# Patient Record
Sex: Female | Born: 1965
Health system: Southern US, Community
[De-identification: ages and names within clinical notes are randomized; demographics above are authoritative.]

## PROBLEM LIST (undated history)

## (undated) DIAGNOSIS — G43909 Migraine, unspecified, not intractable, without status migrainosus: Secondary | ICD-10-CM

## (undated) DIAGNOSIS — Z87891 Personal history of nicotine dependence: Secondary | ICD-10-CM

## (undated) DIAGNOSIS — M542 Cervicalgia: Secondary | ICD-10-CM

## (undated) DIAGNOSIS — M62838 Other muscle spasm: Secondary | ICD-10-CM

## (undated) DIAGNOSIS — F3281 Premenstrual dysphoric disorder: Secondary | ICD-10-CM

## (undated) DIAGNOSIS — J069 Acute upper respiratory infection, unspecified: Secondary | ICD-10-CM

## (undated) HISTORY — DX: Migraine, unspecified, not intractable, without status migrainosus: G43.909

## (undated) HISTORY — PX: BREAST ENHANCEMENT SURGERY: SHX7

## (undated) HISTORY — DX: Cervicalgia: M54.2

## (undated) HISTORY — DX: Acute upper respiratory infection, unspecified: J06.9

## (undated) HISTORY — DX: Other muscle spasm: M62.838

## (undated) HISTORY — DX: Personal history of nicotine dependence: Z87.891

## (undated) HISTORY — DX: Premenstrual dysphoric disorder: F32.81

---

## 2000-09-09 ENCOUNTER — Encounter: Payer: Self-pay | Admitting: Obstetrics & Gynecology

## 2000-09-09 ENCOUNTER — Inpatient Hospital Stay (HOSPITAL_COMMUNITY): Admission: AD | Admit: 2000-09-09 | Discharge: 2000-09-09 | Payer: Self-pay | Admitting: Obstetrics & Gynecology

## 2001-06-29 ENCOUNTER — Inpatient Hospital Stay (HOSPITAL_COMMUNITY): Admission: AD | Admit: 2001-06-29 | Discharge: 2001-06-29 | Payer: Self-pay | Admitting: Obstetrics & Gynecology

## 2002-03-17 ENCOUNTER — Other Ambulatory Visit: Admission: RE | Admit: 2002-03-17 | Discharge: 2002-03-17 | Payer: Self-pay | Admitting: Obstetrics and Gynecology

## 2002-04-21 ENCOUNTER — Ambulatory Visit (HOSPITAL_COMMUNITY): Admission: RE | Admit: 2002-04-21 | Discharge: 2002-04-21 | Payer: Self-pay | Admitting: Obstetrics and Gynecology

## 2002-10-06 ENCOUNTER — Inpatient Hospital Stay (HOSPITAL_COMMUNITY): Admission: AD | Admit: 2002-10-06 | Discharge: 2002-10-09 | Payer: Self-pay | Admitting: Obstetrics and Gynecology

## 2002-10-06 ENCOUNTER — Encounter (INDEPENDENT_AMBULATORY_CARE_PROVIDER_SITE_OTHER): Payer: Self-pay | Admitting: Specialist

## 2003-11-03 ENCOUNTER — Emergency Department (HOSPITAL_COMMUNITY): Admission: EM | Admit: 2003-11-03 | Discharge: 2003-11-03 | Payer: Self-pay | Admitting: Family Medicine

## 2004-08-22 ENCOUNTER — Other Ambulatory Visit: Admission: RE | Admit: 2004-08-22 | Discharge: 2004-08-22 | Payer: Self-pay | Admitting: Obstetrics and Gynecology

## 2005-08-23 ENCOUNTER — Other Ambulatory Visit: Admission: RE | Admit: 2005-08-23 | Discharge: 2005-08-23 | Payer: Self-pay | Admitting: Obstetrics and Gynecology

## 2005-10-23 ENCOUNTER — Ambulatory Visit: Payer: Self-pay | Admitting: Family Medicine

## 2007-04-18 ENCOUNTER — Emergency Department (HOSPITAL_COMMUNITY): Admission: EM | Admit: 2007-04-18 | Discharge: 2007-04-18 | Payer: Self-pay | Admitting: Emergency Medicine

## 2008-12-02 ENCOUNTER — Ambulatory Visit: Payer: Self-pay | Admitting: Family Medicine

## 2008-12-02 DIAGNOSIS — M62838 Other muscle spasm: Secondary | ICD-10-CM | POA: Insufficient documentation

## 2008-12-02 DIAGNOSIS — M542 Cervicalgia: Secondary | ICD-10-CM | POA: Insufficient documentation

## 2009-08-04 ENCOUNTER — Ambulatory Visit: Payer: Self-pay | Admitting: Family Medicine

## 2009-12-02 ENCOUNTER — Ambulatory Visit: Payer: Self-pay | Admitting: Family Medicine

## 2009-12-02 DIAGNOSIS — J069 Acute upper respiratory infection, unspecified: Secondary | ICD-10-CM | POA: Insufficient documentation

## 2009-12-13 ENCOUNTER — Telehealth: Payer: Self-pay | Admitting: Family Medicine

## 2010-06-28 ENCOUNTER — Ambulatory Visit: Payer: Self-pay | Admitting: Family Medicine

## 2010-10-17 NOTE — Progress Notes (Signed)
Summary: pt is not any better  Phone Note Call from Patient Call back at Home Phone [phone]   Caller: Patient Call For: Judith Part MD Summary of Call: Pt was seen on 3/18 and she says she is not any better.  She thinks she has a sinus infection and is requesting an antibiotic.  She said you told her you would send one in for her if she wasnt better.  [facility]. Initial call taken by: Lowella Petties CMA,  December 13, 2009 8:24 AM    New/Updated Medications: AUGMENTIN 875-125 MG TABS (AMOXICILLIN-POT CLAVULANATE) 1 by mouth 2 times daily x 10 days Prescriptions: AUGMENTIN 875-125 MG TABS (AMOXICILLIN-POT CLAVULANATE) 1 by mouth 2 times daily x 10 days  #20 x 0   Entered and Authorized by:   Ruthe Mannan MD   Signed by:   Ruthe Mannan MD on 12/13/2009   Method used:   [facility]        [facility] (retail[facility]       [address]       [place], [place]  [address]       Ph: [phone] or [phone]       Fax: [phone]   RxID:   [id number]   Appended Document: pt is not any better Patient Advised.

## 2010-10-17 NOTE — Assessment & Plan Note (Signed)
Summary: CONGESTION,FATIGUE/CLE   Vital Signs:  Patient profile:   45 year old female Height:      68.5 inches Weight:      160.25 pounds BMI:     24.10 Temp:     98.2 degrees F oral Pulse rate:   80 / minute Pulse rhythm:   regular BP sitting:   100 / 70  (left arm) Cuff size:   regular  Vitals Entered By: Delilah Shan CMA Duncan Dull) (December 02, 2009 9:43 AM) CC: Congestion, fatigue   History of Present Illness: 45 yo with 6 days of URI symptoms.  Started with runny nose, dry cough.  Those symptoms resolved. Ears still popping and just feels "more run down." No SOB, CP, LE edema.   No n/v/d. No rashes.  Current Medications (verified): 1)  None  Allergies: 1)  ! Vicodin (Hydrocodone-Acetaminophen)  Review of Systems      See HPI General:  Denies chills and fever. ENT:  Complains of earache and postnasal drainage; denies difficulty swallowing, ear discharge, sinus pressure, and sore throat. CV:  Denies chest pain or discomfort. Resp:  Complains of cough; denies shortness of breath, sputum productive, and wheezing.  Physical Exam  General:  Well-developed,well-nourished,in no acute distress; alert,appropriate and cooperative throughout examination Ears:  small clear fluid TMs bilaterally Nose:  mucosal erythema.   Mouth:  mild pharyngeal erythema.   Lungs:  Normal respiratory effort, chest expands symmetrically. Lungs are clear to auscultation, no crackles or wheezes. Heart:  Normal rate and regular rhythm. S1 and S2 normal without gallop, murmur, click, rub or other extra sounds. Extremities:  no edema Psych:  Cognition and judgment appear intact. Alert and cooperative with normal attention span and concentration. No apparent delusions, illusions, hallucinations   Impression & Recommendations:  Problem # 1:  URI (ICD-465.9) Assessment New LIkely viral.  Conitnue supportive care with Ibuprofen, sudafed. RTC if no improvement in 5-7 days.  Prior Medications  (reviewed today): None Current Allergies (reviewed today): ! VICODIN (HYDROCODONE-ACETAMINOPHEN)

## 2010-10-17 NOTE — Assessment & Plan Note (Signed)
Summary: flu shot/tower/alc  Nurse Visit   Allergies: 1)  ! Vicodin (Hydrocodone-Acetaminophen)  Orders Added: 1)  Admin 1st Vaccine [90471] 2)  Flu Vaccine 23yrs + [16109]  Flu Vaccine Consent Questions     Do you have a history of severe allergic reactions to this vaccine? no    Any prior history of allergic reactions to egg and/or gelatin? no    Do you have a sensitivity to the preservative Thimersol? no    Do you have a past history of Guillan-Barre Syndrome? no    Do you currently have an acute febrile illness? no    Have you ever had a severe reaction to latex? no    Vaccine information given and explained to patient? yes    Are you currently pregnant? no    Lot Number:[id number]   Exp Date:03/17/2011   Site Given  Left Deltoid IM  Appended Document: flu shot/tower/alc Flu vaccine given in patients right deltoid.

## 2011-02-02 NOTE — Discharge Summary (Signed)
   NAME:  Kathleen Rowe, Kathleen Rowe                           ACCOUNT NO.:  [id number]   MEDICAL RECORD NO.:  [id number]                   PATIENT TYPE:  INP   LOCATION:  9138                                 FACILITY:  [facility]   PHYSICIAN:  Miguel Aschoff, M.D.                    DATE OF BIRTH:  04/09/1966   DATE OF ADMISSION:  10/06/2002  DATE OF DISCHARGE:  10/09/2002                                 DISCHARGE SUMMARY   FINAL DIAGNOSES:  1. Intrauterine pregnancy at term.  2. History of prior low transverse cesarean section.  The patient declines     attempt at vaginal birth after cesarean.  3. The patient desires permanent sterilization.   PROCEDURE:  1. Repeat low transverse cesarean section.  2. Bilateral tubal ligation using the Pomeroy procedure.   SURGEON:  Malva Limes, M.D.   ASSISTANT:  Luvenia Redden, M.D.   COMPLICATIONS:  None.   HISTORY OF PRESENT ILLNESS:  This 45 year old G6, P1-0-4-1 presents at 39+  weeks gestation for repeat cesarean section and tubal ligation.  The patient  was offered a VBAC and declined.  The patient's antepartum course had been  complicated by advanced maternal age.  The patient did receive an  amniocentesis with a 46XX genotype.  Otherwise, patient's antepartum course  had been uncomplicated.  She did have a negative group B Strep culture  performed in the office.  She was admitted at this time and taken to the  operating room on October 06, 2002 where a repeat low transverse cesarean  section was performed with the delivery of an 8 pound 15 ounce female infant  with Apgars of 9 and 10.  The delivery went without complications and at  this point patient still expressed her desires for permanent sterilization  and a bilateral tubal ligation using the Pomeroy procedure was performed.  The procedure went without complications.  The patient's postoperative  course was benign without significant fevers.  She was felt ready for  discharge on postoperative day  number three.  She was sent home on a regular  diet, told to decrease activities, told to continue her prenatal vitamins.  Was told to take Chromagen iron one daily.  Given a prescription for Tylox  one to two q.4h. as needed for pain.  Was to follow up in the office in four  weeks.  Even though patient was Rh negative she did not receive RhoGAM  because her husband is Rh negative as well.   LABORATORIES:  On discharge patient had a hemoglobin of 9.2 and a white  blood cell count of 12.0.     Leilani Able, P.A.-C.                Miguel Aschoff, M.D.    MB/MEDQ  D:  11/12/2002  T:  11/12/2002  Job:  [id number]

## 2011-02-02 NOTE — Op Note (Signed)
NAME:  Kathleen Rowe, Kathleen Rowe                           ACCOUNT NO.:  [id number]   MEDICAL RECORD NO.:  [id number]                   PATIENT TYPE:  INP   LOCATION:  9138                                 [facility]ACILITY:  [facility]   PHYSICIAN:  Malva Limes, M.D.                 DATE OF BIRTH:  1965-11-07   DATE OF PROCEDURE:  10/06/2002  DATE OF DISCHARGE:                                 OPERATIVE REPORT   PREOPERATIVE DIAGNOSES:  1. Intrauterine pregnancy at term.  2. History of prior low transverse cesarean section.  3. Patient declines attempt at vaginal birth after cesarean section.  4. Patient desires permanent sterilization.  5. Advanced maternal age.   POSTOPERATIVE DIAGNOSES:  1. Intrauterine pregnancy at term.  2. History of prior low transverse cesarean section.  3. Patient declines attempt at vaginal birth after cesarean section.  4. Patient desires permanent sterilization.  5. Advanced maternal age.   PROCEDURES:  1. Repeat low transverse cesarean section.  2. Bilateral tubal ligation, Pomeroy procedure.   SURGEON:  Mark E. Dareen Piano, M.D.   ASSISTANT:  Luvenia Redden, M.D.   ESTIMATED BLOOD LOSS:  900 cc.   COMPLICATIONS:  None.   ANTIBIOTICS:  Ancef 1 g.   DRAINS:  Foley to bedside drainage.   SPECIMENS:  Portions of right and left fallopian tubes sent to pathology.   FINDINGS:  The patient had normal-appearing fallopian tubes and ovaries,  bilaterally.  The uterus appeared to be normal.  The placenta appeared to be  normal. The patient delivered one live viable white female infant weighing 8  pounds 15 ounces.   DESCRIPTION OF PROCEDURE:  The patient was taken to the operating room where  a regional anesthetic was administered without complications.  She was then  placed in the dorsal supine position with a left lateral tilt.  The patient  was prepped with Hibiclens and a Foley catheter was placed.  She was draped  in the usual fashion for this procedure.  A  Pfannenstiel incision was made  through the previous scar.  This was carried down to fascia.  The fascia was  entered in the midline and extended laterally with the Mayo scissors.  The  rectus muscles were then taken from the fascia with the Bovie.  The rectus  muscles were divided in the midline and taken superiorly and inferiorly.  The parietal peritoneum was entered sharply.  The bladder flap was taken  down sharply.  A low transverse uterine incision was made in the midline and  extended laterally with blunt dissection.  Amniotic sac was entered sharply  and fluid was noted to be clear.  The infant was delivered with the  assistance of a vacuum extractor.  On delivery of the head, the oropharynx  and nostrils were bulb suctioned.  The remaining infant was then delivered.  The cord was doubly clamped and cut, and the  infant handed to the awaiting  NICU team.  The placenta was then manually removed.  The uterus was then  exteriorized.  The uterine cavity was wiped with a wet lap.  The uterine  incision was closed in a single layer of 0 chromic in a running locking  fashion.  The bladder flap was closed using 3-0 chromic in a running  fashion.  The right fallopian tube was then grasped with a Babcock in the  isthmic portion.  A 2-3 cm knuckle was then doubly ligated with 0 gut  suture.  The knuckle was excised.  Both ostia were visualized.  A similar  procedure was performed on the opposite side.  The posterior cul-de-sac was  then irrigated.  The uterus was placed back into the abdominal cavity.  Hemostasis was again checked and felt to be adequate.  The parietal  peritoneum and rectus muscles were reapproximated in the midline using 3-0  chromic in a running fashion.  The fascia was closed using 0 Monocryl suture  in a running fashion.  Subcuticular tissues were treated with a Bovie.  Pfannenstiel clips were used to close the skin.  The patient tolerated the  procedure well.  She was  taken to the recovery room in stable condition.  Instrument and lap counts were correct x2.                                               Malva Limes, M.D.    MA/MEDQ  D:  10/06/2002  T:  10/06/2002  Job:  [id number]

## 2011-07-25 ENCOUNTER — Ambulatory Visit (INDEPENDENT_AMBULATORY_CARE_PROVIDER_SITE_OTHER): Payer: BC Managed Care – PPO

## 2011-07-25 DIAGNOSIS — Z23 Encounter for immunization: Secondary | ICD-10-CM

## 2012-03-24 ENCOUNTER — Encounter: Payer: Self-pay | Admitting: Family Medicine

## 2012-03-24 ENCOUNTER — Ambulatory Visit (INDEPENDENT_AMBULATORY_CARE_PROVIDER_SITE_OTHER): Payer: BC Managed Care – PPO | Admitting: Family Medicine

## 2012-03-24 VITALS — BP 110/70 | HR 72 | Temp 99.4°F | Wt 172.0 lb

## 2012-03-24 DIAGNOSIS — N390 Urinary tract infection, site not specified: Secondary | ICD-10-CM

## 2012-03-24 LAB — POCT URINALYSIS DIPSTICK
Bilirubin, UA: NEGATIVE
Glucose, UA: NEGATIVE
Ketones, UA: NEGATIVE
Leukocytes, UA: NEGATIVE
Nitrite, UA: POSITIVE
Spec Grav, UA: <=1.005
Urobilinogen, UA: NEGATIVE
pH, UA: 7.5

## 2012-03-24 MED ORDER — CIPROFLOXACIN HCL 500 MG PO TABS: 500.0000 mg | ORAL_TABLET | Freq: Two times a day (BID) | ORAL | Status: AC

## 2012-03-24 MED ORDER — FLUCONAZOLE 150 MG PO TABS: 150.0000 mg | ORAL_TABLET | Freq: Once | ORAL | Status: AC

## 2012-03-24 NOTE — Addendum Note (Signed)
Addended by: Eliezer Bottom on: 03/24/2012 04:09 PM   Modules accepted: Orders

## 2012-03-24 NOTE — Progress Notes (Signed)
SUBJECTIVE: Kathleen Rowe is a 46 y.o. female who complains of urinary frequency, urgency and dysuria x 3 days, without flank pain, fever, chills, or abnormal vaginal discharge or bleeding.   Patient Active Problem List  Diagnosis  . URI  . NECK PAIN, ACUTE  . MUSCLE SPASM, TRAPEZIUS   Past Medical History  Diagnosis Date  . Migraine   . History of tobacco abuse   . PMDD (premenstrual dysphoric disorder)   . Spasm of muscle   . Cervicalgia   . Acute upper respiratory infections of unspecified site    Past Surgical History  Procedure Date  . Breast enhancement surgery   . Cesarean section     x 2   History  Substance Use Topics  . Smoking status: Current Everyday Smoker  . Smokeless tobacco: Not on file  . Alcohol Use: Not on file   Family History  Problem Relation Age of Onset  . Diabetes Son    Allergies  Allergen Reactions  . Hydrocodone-Acetaminophen    No current outpatient prescriptions on file prior to visit.   The PMH, PSH, Social History, Family History, Medications, and allergies have been reviewed in [facility], and have been updated if relevant.  OBJECTIVE:  BP 110/70  Pulse 72  Temp 99.4 F (37.4 C)  Wt 172 lb (78.019 kg)  Appears well, in no apparent distress.  Vital signs are normal. The abdomen is soft without tenderness, guarding, mass, rebound or organomegaly. No CVA tenderness or inguinal adenopathy noted. Urine dipstick shows positive for RBC's, positive for protein and positive for nitrates.    ASSESSMENT: UTI uncomplicated without evidence of pyelonephritis  PLAN: Treatment per orders - cipro 500 mg twice daily x 3 days, send urine for cx, also push fluids, may use Pyridium OTC prn. Call or return to clinic prn if these symptoms worsen or fail to improve as anticipated.

## 2012-03-27 LAB — URINE CULTURE: Colony Count: >=100000

## 2012-07-10 ENCOUNTER — Ambulatory Visit (INDEPENDENT_AMBULATORY_CARE_PROVIDER_SITE_OTHER): Payer: BC Managed Care – PPO

## 2012-07-10 DIAGNOSIS — Z23 Encounter for immunization: Secondary | ICD-10-CM

## 2012-10-01 ENCOUNTER — Other Ambulatory Visit: Payer: Self-pay | Admitting: Obstetrics and Gynecology

## 2012-10-01 DIAGNOSIS — R928 Other abnormal and inconclusive findings on diagnostic imaging of breast: Secondary | ICD-10-CM

## 2012-10-09 ENCOUNTER — Ambulatory Visit
Admission: RE | Admit: 2012-10-09 | Discharge: 2012-10-09 | Disposition: A | Discharge status: Home / Self Care | Payer: BC Managed Care – PPO | Source: Ambulatory Visit | Attending: Obstetrics and Gynecology | Admitting: Obstetrics and Gynecology

## 2012-10-09 DIAGNOSIS — R928 Other abnormal and inconclusive findings on diagnostic imaging of breast: Secondary | ICD-10-CM

## 2013-05-25 ENCOUNTER — Telehealth: Payer: Self-pay | Admitting: *Deleted

## 2013-05-25 ENCOUNTER — Ambulatory Visit (INDEPENDENT_AMBULATORY_CARE_PROVIDER_SITE_OTHER): Payer: BC Managed Care – PPO | Admitting: Family Medicine

## 2013-05-25 ENCOUNTER — Encounter: Payer: Self-pay | Admitting: Family Medicine

## 2013-05-25 VITALS — BP 114/82 | HR 60 | Temp 98.7°F | Ht 69.0 in | Wt 164.5 lb

## 2013-05-25 DIAGNOSIS — L03119 Cellulitis of unspecified part of limb: Secondary | ICD-10-CM

## 2013-05-25 DIAGNOSIS — L02619 Cutaneous abscess of unspecified foot: Secondary | ICD-10-CM

## 2013-05-25 MED ORDER — FLUCONAZOLE 150 MG PO TABS: 150.0000 mg | ORAL_TABLET | Freq: Once | ORAL | Status: DC

## 2013-05-25 MED ORDER — CEPHALEXIN 500 MG PO CAPS: 500.0000 mg | ORAL_CAPSULE | Freq: Three times a day (TID) | ORAL | Status: DC

## 2013-05-25 NOTE — Assessment & Plan Note (Signed)
Wound cx obt from small amt of pus expressed from scab Cleaned and dressed Disc wound care -avoid hydrogen peroxide Keflex 500 tid for 10 d Will watch for worsening erythema/swelling or pain  cx pending

## 2013-05-25 NOTE — Progress Notes (Signed)
  Subjective:    Patient ID: Kathleen Rowe, female    DOB: 01-30-1966, 47 y.o.   MRN: [id number]  HPI Here with sore on foot  Started with a blister on top of her foot 5 weeks ago from a pair of new sandals   Blister popped  Now there is swelling -no drainage Is over the top of her first MTP joint It is painful  No fever or other symptoms  She put peroxide on it at first   There are no active problems to display for this patient.  Past Medical History  Diagnosis Date  . Migraine   . History of tobacco abuse   . PMDD (premenstrual dysphoric disorder)   . Spasm of muscle   . Cervicalgia   . Acute upper respiratory infections of unspecified site    Past Surgical History  Procedure Laterality Date  . Breast enhancement surgery    . Cesarean section      x 2   History  Substance Use Topics  . Smoking status: Current Every Day Smoker  . Smokeless tobacco: Not on file  . Alcohol Use: No   Family History  Problem Relation Age of Onset  . Diabetes Son    Allergies  Allergen Reactions  . Hydrocodone-Acetaminophen    No current outpatient prescriptions on file prior to visit.   No current facility-administered medications on file prior to visit.      Review of Systems Review of Systems  Constitutional: Negative for fever, appetite change, fatigue and unexpected weight change.  Eyes: Negative for pain and visual disturbance.  Respiratory: Negative for cough and shortness of breath.   Cardiovascular: Negative for cp or palpitations    Gastrointestinal: Negative for nausea, diarrhea and constipation.  Genitourinary: Negative for urgency and frequency.  Skin: Negative for pallor or rash   Neurological: Negative for weakness, light-headedness, numbness and headaches.  Hematological: Negative for adenopathy. Does not bruise/bleed easily.  Psychiatric/Behavioral: Negative for dysphoric mood. The patient is not nervous/anxious.         Objective:   Physical Exam   Constitutional: She appears well-developed and well-nourished. No distress.  HENT:  Head: Normocephalic and atraumatic.  Eyes: Conjunctivae and EOM are normal. Pupils are equal, round, and reactive to light.  Cardiovascular: Normal rate and regular rhythm.   Musculoskeletal: She exhibits no edema.  Neurological: She is alert.  Skin: Skin is warm and dry. There is erythema.  1 cm scab over L great toe with .5 cm surrounding erythema and induration  The scab was cleaned and lanced with 21 G needle with small amt of pus expressed - sent for wound cx Dressed with abx oint and band aid  Psychiatric: She has a normal mood and affect.          Assessment & Plan:

## 2013-05-25 NOTE — Telephone Encounter (Signed)
Sent diflucan to her phamacy

## 2013-05-25 NOTE — Patient Instructions (Addendum)
I think you have an infection on your foot -this may drain more Keep clean with antibacterial soap and water  Use any over the counter antibacterial ointment  Use warm compress as often as you can and elevate foot  Loosely cover with a band aid  Take the keflex as directed We will contact you when wound culture returns If symptoms worsen please call

## 2013-05-25 NOTE — Telephone Encounter (Signed)
Pt wanted to make sure you are sending in Rx for yeast inf. med since you started her on abx, pt wanted to make sure it gets sent in since you all discuss it at her OV today but didn't see it on her check out papers

## 2013-05-25 NOTE — Telephone Encounter (Signed)
I will sent that now - yes we did discuss it  Sending diflucan

## 2013-05-28 LAB — WOUND CULTURE: Gram Stain: NONE SEEN

## 2013-07-07 ENCOUNTER — Ambulatory Visit: Payer: BC Managed Care – PPO

## 2013-07-07 ENCOUNTER — Ambulatory Visit (INDEPENDENT_AMBULATORY_CARE_PROVIDER_SITE_OTHER): Payer: BC Managed Care – PPO

## 2013-07-07 DIAGNOSIS — Z23 Encounter for immunization: Secondary | ICD-10-CM

## 2014-07-21 ENCOUNTER — Ambulatory Visit (INDEPENDENT_AMBULATORY_CARE_PROVIDER_SITE_OTHER): Payer: BC Managed Care – PPO

## 2014-07-21 DIAGNOSIS — Z23 Encounter for immunization: Secondary | ICD-10-CM

## 2014-09-13 ENCOUNTER — Telehealth: Payer: Self-pay | Admitting: Family Medicine

## 2014-09-13 MED ORDER — ACYCLOVIR 5 % EX OINT: 1.0000 "application " | TOPICAL_OINTMENT | CUTANEOUS | Status: DC

## 2014-09-13 MED ORDER — ACYCLOVIR 400 MG PO TABS: 400.0000 mg | ORAL_TABLET | Freq: Every day | ORAL | Status: DC

## 2014-09-13 NOTE — Telephone Encounter (Signed)
Pt left vm stating that she has been having recurrent cold sores q month recently.  She is requesting RX for Zovirax oral as well as topical since she is unsure of why this has become an issue again.  [facility] [address].

## 2014-09-13 NOTE — Telephone Encounter (Signed)
Px written for call in   I could not bring up [facility] [address] to send electronically

## 2014-09-14 MED ORDER — ACYCLOVIR 400 MG PO TABS: 400.0000 mg | ORAL_TABLET | Freq: Every day | ORAL | Status: DC

## 2014-09-14 MED ORDER — ACYCLOVIR 5 % EX OINT: 1.0000 "application " | TOPICAL_OINTMENT | CUTANEOUS | Status: DC

## 2014-09-14 NOTE — Telephone Encounter (Signed)
Rxs sent electronically, I found pharmacy in system

## 2015-03-11 ENCOUNTER — Encounter: Payer: Self-pay | Admitting: Primary Care

## 2015-03-11 ENCOUNTER — Ambulatory Visit (INDEPENDENT_AMBULATORY_CARE_PROVIDER_SITE_OTHER): Payer: BLUE CROSS/BLUE SHIELD | Admitting: Primary Care

## 2015-03-11 VITALS — BP 118/80 | HR 68 | Temp 98.0°F | Ht 69.0 in | Wt 162.8 lb

## 2015-03-11 DIAGNOSIS — J209 Acute bronchitis, unspecified: Secondary | ICD-10-CM

## 2015-03-11 MED ORDER — DOXYCYCLINE HYCLATE 100 MG PO TABS: 100.0000 mg | ORAL_TABLET | Freq: Two times a day (BID) | ORAL | Status: DC

## 2015-03-11 MED ORDER — BENZONATATE 200 MG PO CAPS: 200.0000 mg | ORAL_CAPSULE | Freq: Three times a day (TID) | ORAL | Status: DC | PRN

## 2015-03-11 NOTE — Progress Notes (Signed)
Pre visit review using our clinic review tool, if applicable. No additional management support is needed unless otherwise documented below in the visit note. 

## 2015-03-11 NOTE — Progress Notes (Signed)
Subjective:    Patient ID: Kathleen Rowe, female    DOB: 28-May-1966, 49 y.o.   MRN: [id number]  HPI  Kathleen Rowe is a 49 year old female who presents today with a chief complaint of cough. Her cough has been present for over 2 weeks. She also reports sore throat, nasal congestion, sinus pressure that was present for 1 week which started to resolve; however the cough remained, is now productive with greenish sputum, and is not improving. She now feels more fatigued and weak. She's taken sudafed and ibuprofen without relief.   Review of Systems  Constitutional: Positive for chills and fatigue. Negative for fever.  HENT: Positive for congestion. Negative for ear pain, sinus pressure and sore throat.   Respiratory: Positive for cough. Negative for shortness of breath.   Cardiovascular: Negative for chest pain.  Musculoskeletal: Negative for myalgias.  Neurological: Positive for headaches.       Past Medical History  Diagnosis Date  . Migraine   . History of tobacco abuse   . PMDD (premenstrual dysphoric disorder)   . Spasm of muscle   . Cervicalgia   . Acute upper respiratory infections of unspecified site     History   Social History  . Marital Status: Married    Spouse Name: N/A  . Number of Children: 2  . Years of Education: N/A   Occupational History  . Not on file.   Social History Main Topics  . Smoking status: Current Every Day Smoker  . Smokeless tobacco: Not on file  . Alcohol Use: No  . Drug Use: No  . Sexual Activity: Not on file   Other Topics Concern  . Not on file   Social History Narrative    Past Surgical History  Procedure Laterality Date  . Breast enhancement surgery    . Cesarean section      x 2    Family History  Problem Relation Age of Onset  . Diabetes Son     Allergies  Allergen Reactions  . Hydrocodone-Acetaminophen     No current outpatient prescriptions on file prior to visit.   No current facility-administered medications  on file prior to visit.    BP 118/80 mmHg  Pulse 68  Temp(Src) 98 F (36.7 C) (Oral)  Ht 5\' 9"  (1.753 m)  Wt 162 lb 12.8 oz (73.846 kg)  BMI 24.03 kg/m2  SpO2 98%  LMP 03/05/2015    Objective:   Physical Exam  Constitutional: She appears well-nourished. She appears ill.  HENT:  Right Ear: Tympanic membrane and ear canal normal.  Left Ear: Tympanic membrane and ear canal normal.  Nose: Nose normal. Right sinus exhibits no maxillary sinus tenderness and no frontal sinus tenderness. Left sinus exhibits no maxillary sinus tenderness and no frontal sinus tenderness.  Mouth/Throat: Oropharynx is clear and moist.  Eyes: Conjunctivae are normal. Pupils are equal, round, and reactive to light.  Neck: Neck supple.  Cardiovascular: Normal rate and regular rhythm.   Pulmonary/Chest: She has rhonchi in the right upper field and the left upper field.  Lymphadenopathy:    She has no cervical adenopathy.  Skin: Skin is warm and dry.          Assessment & Plan:  Bronchitis:  Present for >2 weeks. Appears ill upon exam. Some rhonchi noted to upper lobes bilaterally. Suspect bacterial involvement at this point and will prescribe Doxy BID x 10 days. Tessalon pearls for cough. Push fluids, rest. Follow up if no  improvement.

## 2015-03-11 NOTE — Patient Instructions (Signed)
Start Doxycycline antibiotics for bronchitis. Take 1 tablet by mouth twice daily for 10 days.  You may take Benzonatate capsules three times daily as needed for cough.  Push intake of water. Rest. Follow up if no improvement in the next 3-4 days. It was nice meeting you!  Acute Bronchitis Bronchitis is inflammation of the airways that extend from the windpipe into the lungs (bronchi). The inflammation often causes mucus to develop. This leads to a cough, which is the most common symptom of bronchitis.  In acute bronchitis, the condition usually develops suddenly and goes away over time, usually in a couple weeks. Smoking, allergies, and asthma can make bronchitis worse. Repeated episodes of bronchitis may cause further lung problems.  CAUSES Acute bronchitis is most often caused by the same virus that causes a cold. The virus can spread from person to person (contagious) through coughing, sneezing, and touching contaminated objects. SIGNS AND SYMPTOMS   Cough.   Fever.   Coughing up mucus.   Body aches.   Chest congestion.   Chills.   Shortness of breath.   Sore throat.  DIAGNOSIS  Acute bronchitis is usually diagnosed through a physical exam. Your health care provider will also ask you questions about your medical history. Tests, such as chest X-rays, are sometimes done to rule out other conditions.  TREATMENT  Acute bronchitis usually goes away in a couple weeks. Oftentimes, no medical treatment is necessary. Medicines are sometimes given for relief of fever or cough. Antibiotic medicines are usually not needed but may be prescribed in certain situations. In some cases, an inhaler may be recommended to help reduce shortness of breath and control the cough. A cool mist vaporizer may also be used to help thin bronchial secretions and make it easier to clear the chest.  HOME CARE INSTRUCTIONS  Get plenty of rest.   Drink enough fluids to keep your urine clear or pale  yellow (unless you have a medical condition that requires fluid restriction). Increasing fluids may help thin your respiratory secretions (sputum) and reduce chest congestion, and it will prevent dehydration.   Take medicines only as directed by your health care provider.  If you were prescribed an antibiotic medicine, finish it all even if you start to feel better.  Avoid smoking and secondhand smoke. Exposure to cigarette smoke or irritating chemicals will make bronchitis worse. If you are a smoker, consider using nicotine gum or skin patches to help control withdrawal symptoms. Quitting smoking will help your lungs heal faster.   Reduce the chances of another bout of acute bronchitis by washing your hands frequently, avoiding people with cold symptoms, and trying not to touch your hands to your mouth, nose, or eyes.   Keep all follow-up visits as directed by your health care provider.  SEEK MEDICAL CARE IF: Your symptoms do not improve after 1 week of treatment.  SEEK IMMEDIATE MEDICAL CARE IF:  You develop an increased fever or chills.   You have chest pain.   You have severe shortness of breath.  You have bloody sputum.   You develop dehydration.  You faint or repeatedly feel like you are going to pass out.  You develop repeated vomiting.  You develop a severe headache. MAKE SURE YOU:   Understand these instructions.  Will watch your condition.  Will get help right away if you are not doing well or get worse. Document Released: 10/11/2004 Document Revised: 01/18/2014 Document Reviewed: 02/24/2013 [facility] Patient Information 2015 [place], [place]. This information is  not intended to replace advice given to you by your health care provider. Make sure you discuss any questions you have with your health care provider.

## 2015-04-28 ENCOUNTER — Other Ambulatory Visit: Payer: Self-pay | Admitting: Obstetrics and Gynecology

## 2015-05-02 LAB — CYTOLOGY - PAP

## 2015-05-04 ENCOUNTER — Other Ambulatory Visit: Payer: Self-pay | Admitting: Obstetrics and Gynecology

## 2015-05-04 DIAGNOSIS — Z1231 Encounter for screening mammogram for malignant neoplasm of breast: Secondary | ICD-10-CM

## 2015-07-01 ENCOUNTER — Other Ambulatory Visit: Payer: Self-pay | Admitting: Obstetrics and Gynecology

## 2015-07-08 ENCOUNTER — Ambulatory Visit (INDEPENDENT_AMBULATORY_CARE_PROVIDER_SITE_OTHER): Payer: BLUE CROSS/BLUE SHIELD

## 2015-07-08 DIAGNOSIS — Z23 Encounter for immunization: Secondary | ICD-10-CM

## 2016-06-24 ENCOUNTER — Telehealth: Payer: Self-pay | Admitting: Family Medicine

## 2016-06-24 DIAGNOSIS — Z0000 Encounter for general adult medical examination without abnormal findings: Secondary | ICD-10-CM | POA: Insufficient documentation

## 2016-06-24 NOTE — Telephone Encounter (Signed)
-----   Message from Alvina Chouerri J Walsh sent at 06/20/2016 11:15 AM EDT ----- Regarding: Lab orders for Wednesday, 10.11.17 Patient is scheduled for CPX labs, please order future labs, Thanks , Camelia Engerri

## 2016-06-27 ENCOUNTER — Other Ambulatory Visit (INDEPENDENT_AMBULATORY_CARE_PROVIDER_SITE_OTHER): Payer: BLUE CROSS/BLUE SHIELD

## 2016-06-27 ENCOUNTER — Other Ambulatory Visit: Payer: BLUE CROSS/BLUE SHIELD

## 2016-06-27 DIAGNOSIS — Z0000 Encounter for general adult medical examination without abnormal findings: Secondary | ICD-10-CM

## 2016-06-27 LAB — CBC WITH DIFFERENTIAL/PLATELET
BASOS PCT: 0.7 % (ref 0.0–3.0)
Basophils Absolute: 0 10*3/uL (ref 0.0–0.1)
EOS PCT: 2.1 % (ref 0.0–5.0)
Eosinophils Absolute: 0.1 10*3/uL (ref 0.0–0.7)
HCT: 41.1 % (ref 36.0–46.0)
Hemoglobin: 13.9 g/dL (ref 12.0–15.0)
LYMPHS PCT: 26.3 % (ref 12.0–46.0)
Lymphs Abs: 1.5 10*3/uL (ref 0.7–4.0)
MCHC: 33.8 g/dL (ref 30.0–36.0)
MCV: 90.4 fl (ref 78.0–100.0)
MONO ABS: 0.3 10*3/uL (ref 0.1–1.0)
Monocytes Relative: 5.8 % (ref 3.0–12.0)
NEUTROS PCT: 65.1 % (ref 43.0–77.0)
Neutro Abs: 3.8 10*3/uL (ref 1.4–7.7)
Platelets: 190 10*3/uL (ref 150.0–400.0)
RBC: 4.55 Mil/uL (ref 3.87–5.11)
RDW: 13.4 % (ref 11.5–15.5)
WBC: 5.8 10*3/uL (ref 4.0–10.5)

## 2016-06-27 LAB — LIPID PANEL
CHOLESTEROL: 171 mg/dL (ref 0–200)
HDL: 56.2 mg/dL (ref >39.00)
LDL CALC: 101 mg/dL — AB (ref 0–99)
NonHDL: 114.51
Total CHOL/HDL Ratio: 3
Triglycerides: 68 mg/dL (ref 0.0–149.0)
VLDL: 13.6 mg/dL (ref 0.0–40.0)

## 2016-06-27 LAB — COMPREHENSIVE METABOLIC PANEL
ALBUMIN: 3.8 g/dL (ref 3.5–5.2)
ALK PHOS: 36 U/L — AB (ref 39–117)
ALT: 15 U/L (ref 0–35)
AST: 17 U/L (ref 0–37)
BUN: 14 mg/dL (ref 6–23)
CHLORIDE: 106 meq/L (ref 96–112)
CO2: 25 mEq/L (ref 19–32)
Calcium: 9.4 mg/dL (ref 8.4–10.5)
Creatinine, Ser: 0.88 mg/dL (ref 0.40–1.20)
GFR: 72.48 mL/min (ref >60.00)
GLUCOSE: 98 mg/dL (ref 70–99)
POTASSIUM: 4.5 meq/L (ref 3.5–5.1)
SODIUM: 139 meq/L (ref 135–145)
TOTAL PROTEIN: 6.5 g/dL (ref 6.0–8.3)
Total Bilirubin: 0.6 mg/dL (ref 0.2–1.2)

## 2016-06-27 LAB — TSH: TSH: 1.75 u[IU]/mL (ref 0.35–4.50)

## 2016-07-04 ENCOUNTER — Other Ambulatory Visit: Payer: BLUE CROSS/BLUE SHIELD

## 2016-07-11 ENCOUNTER — Encounter: Payer: BLUE CROSS/BLUE SHIELD | Admitting: Family Medicine

## 2016-07-24 ENCOUNTER — Ambulatory Visit (INDEPENDENT_AMBULATORY_CARE_PROVIDER_SITE_OTHER): Payer: BLUE CROSS/BLUE SHIELD | Admitting: Family Medicine

## 2016-07-24 ENCOUNTER — Encounter: Payer: Self-pay | Admitting: Family Medicine

## 2016-07-24 VITALS — BP 104/66 | HR 62 | Temp 98.6°F | Ht 67.25 in | Wt 172.0 lb

## 2016-07-24 DIAGNOSIS — Z0000 Encounter for general adult medical examination without abnormal findings: Secondary | ICD-10-CM

## 2016-07-24 DIAGNOSIS — Z23 Encounter for immunization: Secondary | ICD-10-CM | POA: Diagnosis not present

## 2016-07-24 DIAGNOSIS — Z1231 Encounter for screening mammogram for malignant neoplasm of breast: Secondary | ICD-10-CM | POA: Diagnosis not present

## 2016-07-24 DIAGNOSIS — F17200 Nicotine dependence, unspecified, uncomplicated: Secondary | ICD-10-CM | POA: Diagnosis not present

## 2016-07-24 MED ORDER — TETANUS-DIPHTH-ACELL PERTUSSIS 5-2.5-18.5 LF-MCG/0.5 IM SUSP
0.5000 mL | Freq: Once | INTRAMUSCULAR | Status: AC
  Administered 2016-07-24: 0.5 mL via INTRAMUSCULAR

## 2016-07-24 NOTE — Progress Notes (Signed)
Subjective:    Patient ID: Kathleen Rowe, female    DOB: 08/01/1966, 50 y.o.   MRN: [id number]  HPI Here for health maintenance exam and to review chronic medical problems    Feeling good overall  Recent uri after airplane ride  Feeling better   Had an episode of dizziness-went to ED ? Migraine  Better now   Wt Readings from Last 3 Encounters:  07/24/16 172 lb (78 kg)  03/11/15 162 lb 12.8 oz (73.8 kg)  05/25/13 164 lb 8 oz (74.6 kg)  eating a healthy diet  Exercise -working on it ( hard to fit it in ) -long work hours  bmi is 26.7   Stressful job but she loves it   HIV screen- declines/not high risk   Tetanus shot -? When her last one was  Will get one today   No family hx of colon cancer   Flu shot- had today  Will give a pneumonia vaccine in the future (flu and Tdap vaccine today)  Pap 8/16-neg/ Dr Henderson CloudHorvath Having some problems  10/16 had some ovarian cysts and also period issues  Had D and C  Periods are irregular  No menses since July   ? Perimenopause -likely  Has had BTL Will see gyn yearly    Mammogram 1/14 normal Last one was 1 1/2 years ago at Dr Thurmon FairHorvaths office and then had repeat  Would like ref to the breast center  Self breast exam   Smoking status - about 10-11 cig per day  Is not ready to quit yet  She has tried e cigs -does not like them   Results for orders placed or performed in visit on 06/27/16  CBC with Differential/Platelet  Result Value Ref Range   WBC 5.8 4.0 - 10.5 K/uL   RBC 4.55 3.87 - 5.11 Mil/uL   Hemoglobin 13.9 12.0 - 15.0 g/dL   HCT 62.141.1 30.836.0 - 65.746.0 %   MCV 90.4 78.0 - 100.0 fl   MCHC 33.8 30.0 - 36.0 g/dL   RDW 84.613.4 96.211.5 - 95.215.5 %   Platelets 190.0 150.0 - 400.0 K/uL   Neutrophils Relative % 65.1 43.0 - 77.0 %   Lymphocytes Relative 26.3 12.0 - 46.0 %   Monocytes Relative 5.8 3.0 - 12.0 %   Eosinophils Relative 2.1 0.0 - 5.0 %   Basophils Relative 0.7 0.0 - 3.0 %   Neutro Abs 3.8 1.4 - 7.7 K/uL   Lymphs Abs 1.5  0.7 - 4.0 K/uL   Monocytes Absolute 0.3 0.1 - 1.0 K/uL   Eosinophils Absolute 0.1 0.0 - 0.7 K/uL   Basophils Absolute 0.0 0.0 - 0.1 K/uL  Comprehensive metabolic panel  Result Value Ref Range   Sodium 139 135 - 145 mEq/L   Potassium 4.5 3.5 - 5.1 mEq/L   Chloride 106 96 - 112 mEq/L   CO2 25 19 - 32 mEq/L   Glucose, Bld 98 70 - 99 mg/dL   BUN 14 6 - 23 mg/dL   Creatinine, Ser 8.410.88 0.40 - 1.20 mg/dL   Total Bilirubin 0.6 0.2 - 1.2 mg/dL   Alkaline Phosphatase 36 (L) 39 - 117 U/L   AST 17 0 - 37 U/L   ALT 15 0 - 35 U/L   Total Protein 6.5 6.0 - 8.3 g/dL   Albumin 3.8 3.5 - 5.2 g/dL   Calcium 9.4 8.4 - 32.410.5 mg/dL   GFR 40.1072.48 >27.25>60.00 mL/min  Lipid panel  Result Value Ref Range  Cholesterol 171 0 - 200 mg/dL   Triglycerides 35.3 0.0 - 149.0 mg/dL   HDL 61.44 >31.54 mg/dL   VLDL 00.8 0.0 - 67.6 mg/dL   LDL Cholesterol 195 (H) 0 - 99 mg/dL   Total CHOL/HDL Ratio 3    NonHDL 114.51   TSH  Result Value Ref Range   TSH 1.75 0.35 - 4.50 uIU/mL    Cholesterol is pretty well controlled with diet   Patient Active Problem List   Diagnosis Date Noted  . Screening mammogram, encounter for 07/24/2016  . Routine general medical examination at a health care facility 06/24/2016   Past Medical History:  Diagnosis Date  . Acute upper respiratory infections of unspecified site   . Cervicalgia   . History of tobacco abuse   . Migraine   . PMDD (premenstrual dysphoric disorder)   . Spasm of muscle    Past Surgical History:  Procedure Laterality Date  . BREAST ENHANCEMENT SURGERY    . CESAREAN SECTION     x 2   Social History  Substance Use Topics  . Smoking status: Current Every Day Smoker  . Smokeless tobacco: Never Used  . Alcohol use No   Family History  Problem Relation Age of Onset  . Diabetes Son    Allergies  Allergen Reactions  . Hydrocodone-Acetaminophen    No current outpatient prescriptions on file prior to visit.   No current facility-administered medications  on file prior to visit.     Review of Systems Review of Systems  Constitutional: Negative for fever, appetite change, fatigue and unexpected weight change.  Eyes: Negative for pain and visual disturbance.  Respiratory: Negative for cough and shortness of breath.   Cardiovascular: Negative for cp or palpitations    Gastrointestinal: Negative for nausea, diarrhea and constipation.  Genitourinary: Negative for urgency and frequency.  Skin: Negative for pallor or rash   Neurological: Negative for weakness, light-headedness, numbness and headaches.  Hematological: Negative for adenopathy. Does not bruise/bleed easily.  Psychiatric/Behavioral: Negative for dysphoric mood. The patient is not nervous/anxious.  pos for stressors        Objective:   Physical Exam  Constitutional: She appears well-developed and well-nourished. No distress.  Well appearing   HENT:  Head: Normocephalic and atraumatic.  Right Ear: External ear normal.  Left Ear: External ear normal.  Mouth/Throat: Oropharynx is clear and moist.  Eyes: Conjunctivae and EOM are normal. Pupils are equal, round, and reactive to light. No scleral icterus.  Neck: Normal range of motion. Neck supple. No JVD present. Carotid bruit is not present. No thyromegaly present.  Cardiovascular: Normal rate, regular rhythm, normal heart sounds and intact distal pulses.  Exam reveals no gallop.   Pulmonary/Chest: Effort normal and breath sounds normal. No respiratory distress. She has no wheezes. She exhibits no tenderness.  Slightly distant bs  No wheeze   Abdominal: Soft. Bowel sounds are normal. She exhibits no distension, no abdominal bruit and no mass. There is no tenderness.  Genitourinary: No breast swelling, tenderness, discharge or bleeding.  Genitourinary Comments: Breast exam: No mass, nodules, thickening, tenderness, bulging, retraction, inflamation, nipple discharge or skin changes noted.  No axillary or clavicular LA.        Musculoskeletal: Normal range of motion. She exhibits no edema or tenderness.  Lymphadenopathy:    She has no cervical adenopathy.  Neurological: She is alert. She has normal reflexes. No cranial nerve deficit. She exhibits normal muscle tone. Coordination normal.  Skin: Skin is warm and  dry. No rash noted. No erythema. No pallor.  Lentigines on trunk and limbs  Psychiatric: She has a normal mood and affect.          Assessment & Plan:   Problem List Items Addressed This Visit      Other   Routine general medical examination at a health care facility    Reviewed health habits including diet and exercise and skin cancer prevention Reviewed appropriate screening tests for age  Also reviewed health mt list, fam hx and immunization status , as well as social and family history   See HPI Labs reviewed Tdap vaccine today  Flu vaccine today  Stop at check out for referral for mammogram  For cholesterol    Avoid red meat/ fried foods/ egg yolks/ fatty breakfast meats/ butter, cheese and high fat dairy/ and shellfish    Try to get 1200-1500 mg of calcium per day with at least 1000 iu of vitamin D - for bone health   Exercise is also important for your bones  Keep thinking about quitting smoking  Follow up with gyn as planned       Screening mammogram, encounter for - Primary    Scheduled annual screening mammogram Nl breast exam today  Encouraged monthly self exams        Relevant Orders   MM DIGITAL SCREENING W/ IMPLANTS BILATERAL   Smoker    Disc in detail risks of smoking and possible outcomes including copd, vascular/ heart disease, cancer , respiratory and sinus infections  Pt voices understanding        Other Visit Diagnoses    Need for influenza vaccination       Relevant Orders   Flu Vaccine QUAD 36+ mos IM (Completed)   Need for Tdap vaccination       Relevant Medications   Tdap (BOOSTRIX) injection 0.5 mL (Completed)

## 2016-07-24 NOTE — Progress Notes (Signed)
Pre visit review using our clinic review tool, if applicable. No additional management support is needed unless otherwise documented below in the visit note. 

## 2016-07-24 NOTE — Patient Instructions (Addendum)
Tdap vaccine today  Flu vaccine today  Stop at check out for referral for mammogram  For cholesterol    Avoid red meat/ fried foods/ egg yolks/ fatty breakfast meats/ butter, cheese and high fat dairy/ and shellfish    Try to get 1200-1500 mg of calcium per day with at least 1000 iu of vitamin D - for bone health   Exercise is also important for your bones  Keep thinking about quitting smoking

## 2016-07-26 DIAGNOSIS — F17200 Nicotine dependence, unspecified, uncomplicated: Secondary | ICD-10-CM | POA: Insufficient documentation

## 2016-07-26 NOTE — Assessment & Plan Note (Signed)
Scheduled annual screening mammogram Nl breast exam today  Encouraged monthly self exams   

## 2016-07-26 NOTE — Assessment & Plan Note (Signed)
Disc in detail risks of smoking and possible outcomes including copd, vascular/ heart disease, cancer , respiratory and sinus infections  Pt voices understanding  

## 2016-07-26 NOTE — Assessment & Plan Note (Signed)
Reviewed health habits including diet and exercise and skin cancer prevention Reviewed appropriate screening tests for age  Also reviewed health mt list, fam hx and immunization status , as well as social and family history   See HPI Labs reviewed Tdap vaccine today  Flu vaccine today  Stop at check out for referral for mammogram  For cholesterol    Avoid red meat/ fried foods/ egg yolks/ fatty breakfast meats/ butter, cheese and high fat dairy/ and shellfish    Try to get 1200-1500 mg of calcium per day with at least 1000 iu of vitamin D - for bone health   Exercise is also important for your bones  Keep thinking about quitting smoking  Follow up with gyn as planned

## 2016-08-28 ENCOUNTER — Ambulatory Visit: Payer: BLUE CROSS/BLUE SHIELD

## 2016-09-21 ENCOUNTER — Ambulatory Visit
Admission: RE | Admit: 2016-09-21 | Discharge: 2016-09-21 | Disposition: A | Discharge status: Home / Self Care | Payer: BLUE CROSS/BLUE SHIELD | Source: Ambulatory Visit | Attending: Family Medicine | Admitting: Family Medicine

## 2016-09-21 DIAGNOSIS — Z1231 Encounter for screening mammogram for malignant neoplasm of breast: Secondary | ICD-10-CM | POA: Diagnosis not present

## 2017-07-08 ENCOUNTER — Other Ambulatory Visit: Payer: Self-pay | Admitting: *Deleted

## 2017-07-08 MED ORDER — ACYCLOVIR 400 MG PO TABS: 400.0000 mg | ORAL_TABLET | Freq: Every day | ORAL | 3 refills | Status: DC

## 2017-07-08 NOTE — Telephone Encounter (Signed)
I sent it with some refills - but looks like the px failed to send?   Could you please call it in ? Take it for 5 d (can stop earlier if better

## 2017-07-08 NOTE — Telephone Encounter (Signed)
Rx resent electronically and it did go through this time. Left voicemail letting pt know Rx sent and advise pt of Dr. Royden Purlower's comments

## 2017-07-08 NOTE — Telephone Encounter (Signed)
Pt called the call Center (I returned her call) and she said she has had a bad cold sore on her lip that hasn't resolved with OTC med. Pt said in the past Dr. Milinda Antisower has prescribed her acyclovir 400 mg with direction of (Take 1 tablet (400 mg total) by mouth 5 (five) times daily. For 5 days for a cold sore as needed), pt is requesting a refill of medication. Last filled was in 2015, please advise   Pt uses [facility] [address] [place]

## 2017-07-17 ENCOUNTER — Ambulatory Visit: Payer: BLUE CROSS/BLUE SHIELD

## 2017-07-31 ENCOUNTER — Ambulatory Visit (INDEPENDENT_AMBULATORY_CARE_PROVIDER_SITE_OTHER): Payer: BLUE CROSS/BLUE SHIELD

## 2017-07-31 DIAGNOSIS — Z23 Encounter for immunization: Secondary | ICD-10-CM

## 2017-07-31 NOTE — Progress Notes (Signed)
Patient requested flu vaccine. Fluarix Quad administered to left deltoid. Patient tolerated injection well. Patient provided VIS per protocol.

## 2018-03-19 ENCOUNTER — Ambulatory Visit: Payer: Self-pay | Admitting: *Deleted

## 2018-03-19 NOTE — Telephone Encounter (Signed)
Pt has appt with Dr Milinda Antisower 03/24/18 at 2:45.

## 2018-03-19 NOTE — Telephone Encounter (Signed)
Pt called with having some swelling in her feet and ankles that go away when she elevates them. She had just started walking and thinks it is because of that. No cardiac symptoms. Denies fever. She is also having some throbbing in her left leg that only comes when she is in the bed, trying to sleep. It is fine when walking. No red streaks or lumps with this leg. This has been happening for about 4 months.  Appointment scheduled per protocol. Pt voiced understanding. Home care advice given with verbal understanding.  Pt advised to call back for increase in symptoms (cardiac) or worsening.  [facility]  Reason for Disposition . Swollen ankle joint  (Exception: area of localized swelling which is itchy)  Answer Assessment - Initial Assessment Questions 1. LOCATION: "Which joint is swollen?"     Ankle both 2. ONSET: "When did the swelling start?"     This week 3. SIZE: "How large is the swelling?"     Big enough to cover the ankle joint 4. PAIN: "Is there any pain?" If so, ask: "How bad is it?" (Scale 1-10; or mild, moderate, severe)     tingling 5. CAUSE: "What do you think caused the swollen joint?"     Just being more sedetary 6. OTHER SYMPTOMS: "Do you have any other symptoms?" (e.g., fever, chest pain, difficulty breathing, calf pain)     no  Answer Assessment - Initial Assessment Questions 1. ONSET: "When did the pain start?"      Four months 2. LOCATION: "Where is the pain located?"      Left left leg  3. PAIN: "How bad is the pain?"    (Scale 1-10; or mild, moderate, severe)   -  MILD (1-3): doesn't interfere with normal activities    -  MODERATE (4-7): interferes with normal activities (e.g., work or school) or awakens from sleep, limping    -  SEVERE (8-10): excruciating pain, unable to do any normal activities, unable to walk     pain in the leg #8, throbbing, only at night 4. WORK OR EXERCISE: "Has there been any recent work or exercise that  involved this part of the body?"      Walking  5. CAUSE: "What do you think is causing the leg pain?"     Not sure, sciatic pain?  6. OTHER SYMPTOMS: "Do you have any other symptoms?" (e.g., chest pain, back pain, breathing difficulty, swelling, rash, fever, numbness, weakness)     Swelling in ankles and feet 7. PREGNANCY: "Is there any chance you are pregnant?" "When was your last menstrual period?"     No, about 6 months ago  Protocols used: ANKLE SWELLING-A-AH, LEG PAIN-A-AH

## 2018-03-19 NOTE — Telephone Encounter (Signed)
I will see her then  

## 2018-03-24 ENCOUNTER — Encounter: Payer: Self-pay | Admitting: Family Medicine

## 2018-03-24 ENCOUNTER — Ambulatory Visit: Payer: BLUE CROSS/BLUE SHIELD | Admitting: Family Medicine

## 2018-03-24 ENCOUNTER — Ambulatory Visit (INDEPENDENT_AMBULATORY_CARE_PROVIDER_SITE_OTHER)
Admission: RE | Admit: 2018-03-24 | Discharge: 2018-03-24 | Disposition: A | Discharge status: Home / Self Care | Payer: BLUE CROSS/BLUE SHIELD | Source: Ambulatory Visit | Attending: Family Medicine | Admitting: Family Medicine

## 2018-03-24 VITALS — BP 122/68 | HR 63 | Temp 98.6°F | Ht 67.25 in | Wt 172.5 lb

## 2018-03-24 DIAGNOSIS — M79605 Pain in left leg: Secondary | ICD-10-CM

## 2018-03-24 DIAGNOSIS — R600 Localized edema: Secondary | ICD-10-CM | POA: Insufficient documentation

## 2018-03-24 DIAGNOSIS — F17200 Nicotine dependence, unspecified, uncomplicated: Secondary | ICD-10-CM | POA: Diagnosis not present

## 2018-03-24 DIAGNOSIS — M545 Low back pain: Secondary | ICD-10-CM | POA: Diagnosis not present

## 2018-03-24 NOTE — Progress Notes (Signed)
Subjective:    Patient ID: Kathleen Rowe, female    DOB: 1966/05/24, 52 y.o.   MRN: [id number]  HPI  Here for swelling in ankles and feet (worse on L)   Wt Readings from Last 3 Encounters:  03/24/18 172 lb 8 oz (78.2 kg)  07/24/16 172 lb (78 kg)  03/11/15 162 lb 12.8 oz (73.8 kg)   26.82 kg/m   Current smoker- smokes about 15 cig per day / less than a pack  Perimenopausal- symptomatic   Symptoms for about 4 months  Since feb or march In May she started walking to loose weight- noted both ankles swelled  Also throbbing in L leg that happens when in bed   Wonders about sciatic nerve problems since pain goes from buttocks (just under the cheek)  all the way down to her foot on the L  Throbs at night  Feels it during the day- not nearly as bad Seems to be positional   Has foot issues/ also bunions    No redness or streaks   Driving to [place] for her job/ and a lot of sitting (dependent)   Works for a [employer]  Very very stressful job / but she loves it  Massage may help a little   Eating healthy- salads and fish   No sob or cp   occ nsaid-not much   Would be interested in support stockings   Patient Active Problem List   Diagnosis Date Noted  . Pedal edema 03/24/2018  . Left leg pain 03/24/2018  . Smoker 07/26/2016  . Screening mammogram, encounter for 07/24/2016  . Routine general medical examination at a health care facility 06/24/2016   Past Medical History:  Diagnosis Date  . Acute upper respiratory infections of unspecified site   . Cervicalgia   . History of tobacco abuse   . Migraine   . PMDD (premenstrual dysphoric disorder)   . Spasm of muscle    Past Surgical History:  Procedure Laterality Date  . BREAST ENHANCEMENT SURGERY    . CESAREAN SECTION     x 2   Social History   Tobacco Use  . Smoking status: Current Every Day Smoker    Packs/day: 0.50  . Smokeless tobacco: Never Used  Substance Use Topics  . Alcohol use: No   . Drug use: No   Family History  Problem Relation Age of Onset  . Diabetes Son    Allergies  Allergen Reactions  . Hydrocodone-Acetaminophen    Current Outpatient Medications on File Prior to Visit  Medication Sig Dispense Refill  . acyclovir (ZOVIRAX) 400 MG tablet Take 1 tablet (400 mg total) by mouth 5 (five) times daily. 15 tablet 3   No current facility-administered medications on file prior to visit.      Review of Systems  Constitutional: Negative for activity change, appetite change, fatigue, fever and unexpected weight change.  HENT: Negative for congestion, ear pain, rhinorrhea, sinus pressure and sore throat.   Eyes: Negative for pain, redness and visual disturbance.  Respiratory: Negative for cough, shortness of breath and wheezing.   Cardiovascular: Positive for leg swelling. Negative for chest pain and palpitations.  Gastrointestinal: Negative for abdominal distention, abdominal pain, blood in stool, constipation and diarrhea.  Endocrine: Negative for polydipsia and polyuria.  Genitourinary: Negative for dysuria, frequency and urgency.  Musculoskeletal: Negative for arthralgias, back pain, joint swelling and myalgias.       L leg and buttock throbbing pain  Skin: Negative for pallor and rash.  Allergic/Immunologic: Negative for environmental allergies.  Neurological: Negative for dizziness, syncope and headaches.  Hematological: Negative for adenopathy. Does not bruise/bleed easily.  Psychiatric/Behavioral: Negative for decreased concentration and dysphoric mood. The patient is not nervous/anxious.        Objective:   Physical Exam  Constitutional: She appears well-developed and well-nourished. No distress.  Well appearing   HENT:  Head: Normocephalic and atraumatic.  Mouth/Throat: Oropharynx is clear and moist.  Eyes: Pupils are equal, round, and reactive to light. Conjunctivae and EOM are normal.  Neck: Normal range of motion. Neck supple. No JVD  present. Carotid bruit is not present. No thyromegaly present.  Cardiovascular: Normal rate, regular rhythm, normal heart sounds and intact distal pulses. Exam reveals no gallop.  Pulmonary/Chest: Effort normal and breath sounds normal. No respiratory distress. She has no wheezes. She has no rales.  No crackles Mildly distant bs No wheeze  Abdominal: Soft. Bowel sounds are normal. She exhibits no distension, no abdominal bruit and no mass. There is no tenderness. There is no rebound and no guarding. No hernia.  Genitourinary:  Genitourinary Comments: No groin mass or discomfort   Musculoskeletal: She exhibits edema and tenderness. She exhibits no deformity.  Trace pedal edema in ankles (more on L than R)  No calf tenderness or palp cords No erythema or warmth Neg Homan's sign   Nl rom hip / LS /knee and ankle on L  Nl gait   Tender over L SI joint on exam No spine tenderness    Lymphadenopathy:    She has no cervical adenopathy.       Right: No inguinal adenopathy present.       Left: No inguinal adenopathy present.  Neurological: She is alert. She has normal reflexes. She displays normal reflexes. No cranial nerve deficit or sensory deficit. She exhibits normal muscle tone. Coordination normal.  Skin: Skin is warm and dry. No rash noted. No erythema. No pallor.  Psychiatric: She has a normal mood and affect.  Pleasant  Voices stressors           Assessment & Plan:   Problem List Items Addressed This Visit      Other   Left leg pain - Primary    From base of buttock down to ankle- much worse at night in bed / sounds positional  Pain over SI joint on exam  No neuro findings Suspect radiculopathy LS film today and plan to follow  Unsure if this is related to lower leg/ankle swelling       Relevant Orders   DG Lumbar Spine Complete   Pedal edema    bilat but worse on L and dependent No cardiac red flags No features of DVT Re assuring exam  Labs today  Also has  pain in upper leg/ buttock-unsure if related       Relevant Orders   Renal function panel   TSH   Hepatic function panel   Smoker    Disc in detail risks of smoking and possible outcomes including copd, vascular/ heart disease, cancer , respiratory and sinus infections  Pt voices understanding  She is not ready to quit

## 2018-03-24 NOTE — Assessment & Plan Note (Signed)
Disc in detail risks of smoking and possible outcomes including copd, vascular/ heart disease, cancer , respiratory and sinus infections  Pt voices understanding She is not ready to quit  

## 2018-03-24 NOTE — Assessment & Plan Note (Signed)
From base of buttock down to ankle- much worse at night in bed / sounds positional  Pain over SI joint on exam  No neuro findings Suspect radiculopathy LS film today and plan to follow  Unsure if this is related to lower leg/ankle swelling

## 2018-03-24 NOTE — Assessment & Plan Note (Signed)
bilat but worse on L and dependent No cardiac red flags No features of DVT Re assuring exam  Labs today  Also has pain in upper leg/ buttock-unsure if related

## 2018-03-24 NOTE — Patient Instructions (Signed)
Xray of lumbar spine today - to see if something is going on with back/sacroiliac area   Use heat on the area that hurts  Take tylenol  Try to choose a comfortable position   Labs for swelling (edema)  Exam is re assuring   Keep thinking about quitting smoking

## 2018-03-25 ENCOUNTER — Telehealth: Payer: Self-pay | Admitting: Family Medicine

## 2018-03-25 DIAGNOSIS — M79605 Pain in left leg: Secondary | ICD-10-CM

## 2018-03-25 LAB — RENAL FUNCTION PANEL
Albumin: 3.9 g/dL (ref 3.5–5.2)
BUN: 15 mg/dL (ref 6–23)
CO2: 27 meq/L (ref 19–32)
CREATININE: 0.81 mg/dL (ref 0.40–1.20)
Calcium: 8.5 mg/dL (ref 8.4–10.5)
Chloride: 106 mEq/L (ref 96–112)
GFR: 79.2 mL/min (ref >60.00)
Glucose, Bld: 92 mg/dL (ref 70–99)
PHOSPHORUS: 3.5 mg/dL (ref 2.3–4.6)
Potassium: 4.1 mEq/L (ref 3.5–5.1)
Sodium: 138 mEq/L (ref 135–145)

## 2018-03-25 LAB — HEPATIC FUNCTION PANEL
ALBUMIN: 3.9 g/dL (ref 3.5–5.2)
ALT: 12 U/L (ref 0–35)
AST: 15 U/L (ref 0–37)
Alkaline Phosphatase: 31 U/L — ABNORMAL LOW (ref 39–117)
Bilirubin, Direct: 0.1 mg/dL (ref 0.0–0.3)
TOTAL PROTEIN: 6.3 g/dL (ref 6.0–8.3)
Total Bilirubin: 0.4 mg/dL (ref 0.2–1.2)

## 2018-03-25 LAB — TSH: TSH: 1.89 u[IU]/mL (ref 0.35–4.50)

## 2018-03-25 NOTE — Telephone Encounter (Signed)
Ref done  Will route to [facility] 

## 2018-03-25 NOTE — Telephone Encounter (Signed)
-----   Message from Shap[place] M [place], [place]on Millet[place] at 03/25/2018 12:31 PM EDT ----- Pt notified of xray results and Dr. Royden Purlower's comments off of mychart. Pt agrees with Ortho referral, please put referral in and I advise pt [facility].

## 2018-03-27 NOTE — Telephone Encounter (Signed)
Called patient and she is feeling better and wants us to call her in two weeks to see if she wants to cancel the referral or not.

## 2018-06-26 ENCOUNTER — Ambulatory Visit (INDEPENDENT_AMBULATORY_CARE_PROVIDER_SITE_OTHER): Payer: BLUE CROSS/BLUE SHIELD

## 2018-06-26 DIAGNOSIS — Z23 Encounter for immunization: Secondary | ICD-10-CM

## 2018-09-19 ENCOUNTER — Other Ambulatory Visit: Payer: Self-pay | Admitting: Family Medicine

## 2018-09-19 NOTE — Telephone Encounter (Signed)
Last OV was 03/24/18, last filled on 07/08/17 #15 tabs with 3 refills, please advise

## 2018-09-19 NOTE — Telephone Encounter (Signed)
Please refill times 3 

## 2018-09-19 NOTE — Telephone Encounter (Signed)
done

## 2019-07-14 ENCOUNTER — Ambulatory Visit (INDEPENDENT_AMBULATORY_CARE_PROVIDER_SITE_OTHER): Payer: BC Managed Care – PPO

## 2019-07-14 DIAGNOSIS — Z23 Encounter for immunization: Secondary | ICD-10-CM

## 2019-08-10 IMAGING — DX DG LUMBAR SPINE COMPLETE 4+V
5 series · 5 of 5 positions shown · non-contrast
Comparison: None.

CLINICAL DATA: 52-year-old female with pain in left leg. Tenderness
sacroiliac region. No reported injury. Initial encounter.

EXAM:
LUMBAR SPINE - COMPLETE 4+ VIEW

[l-spine ap]
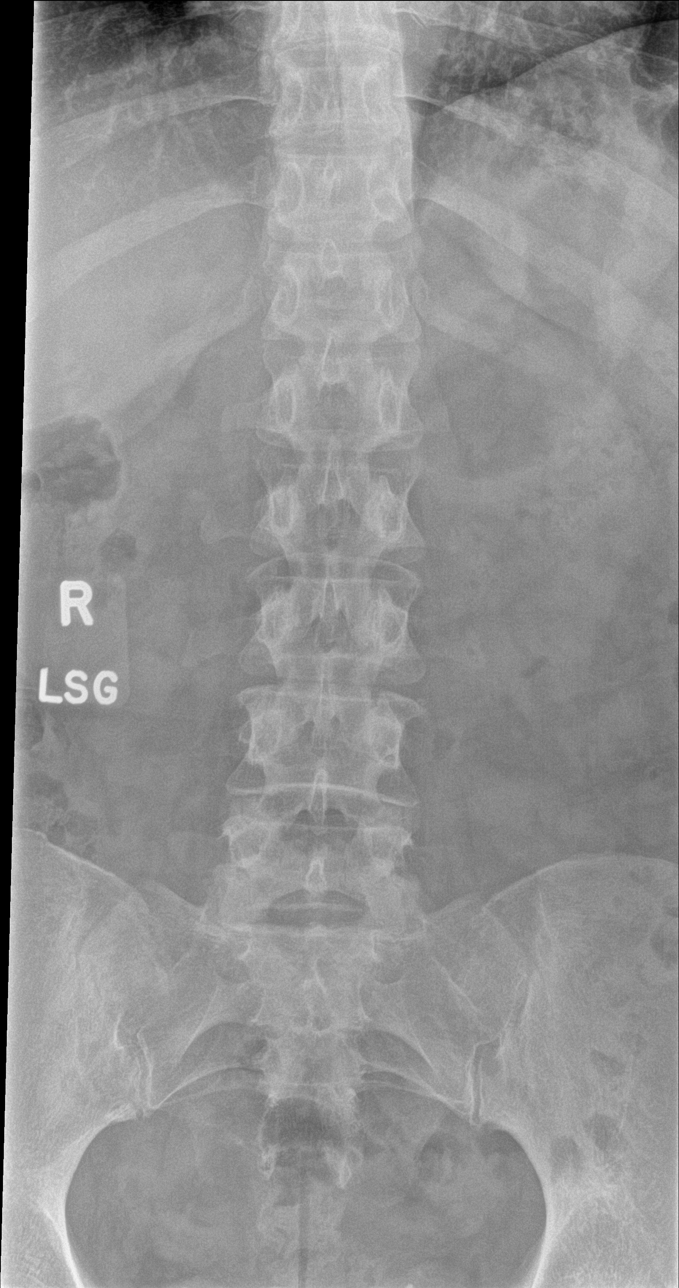

[l-spine obl (1 of 2)]
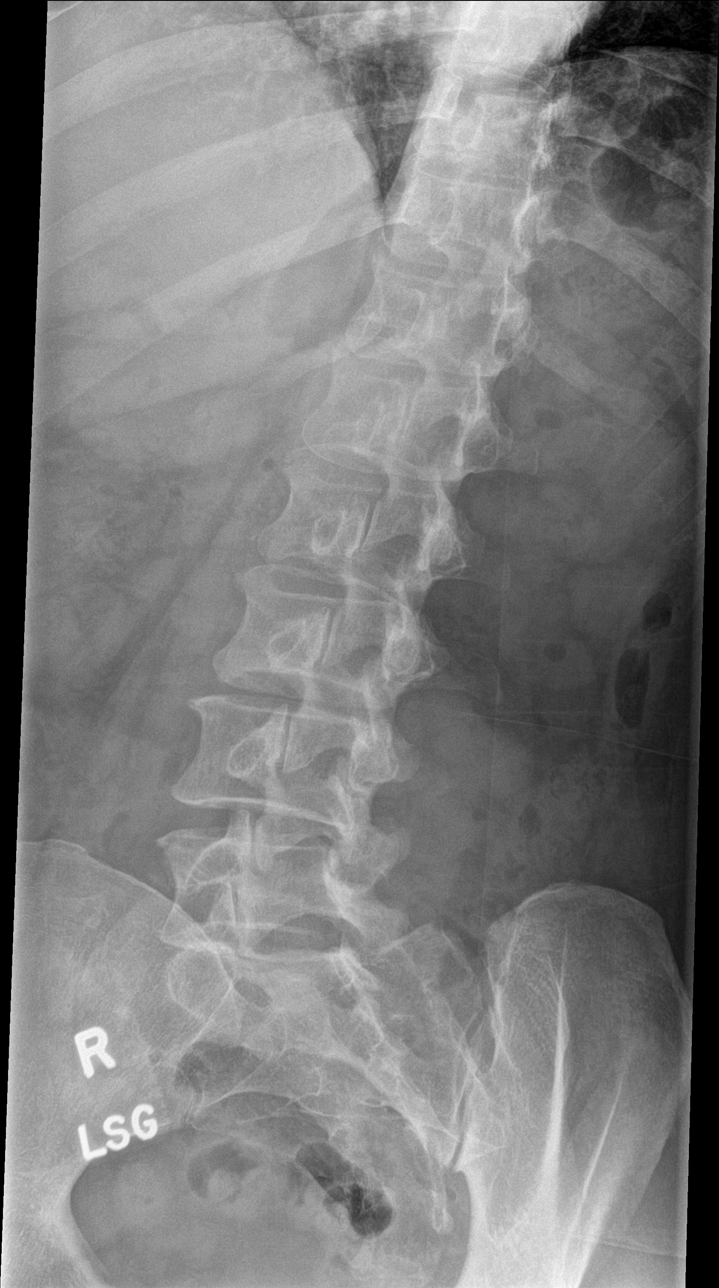

[l-spine obl (2 of 2)]
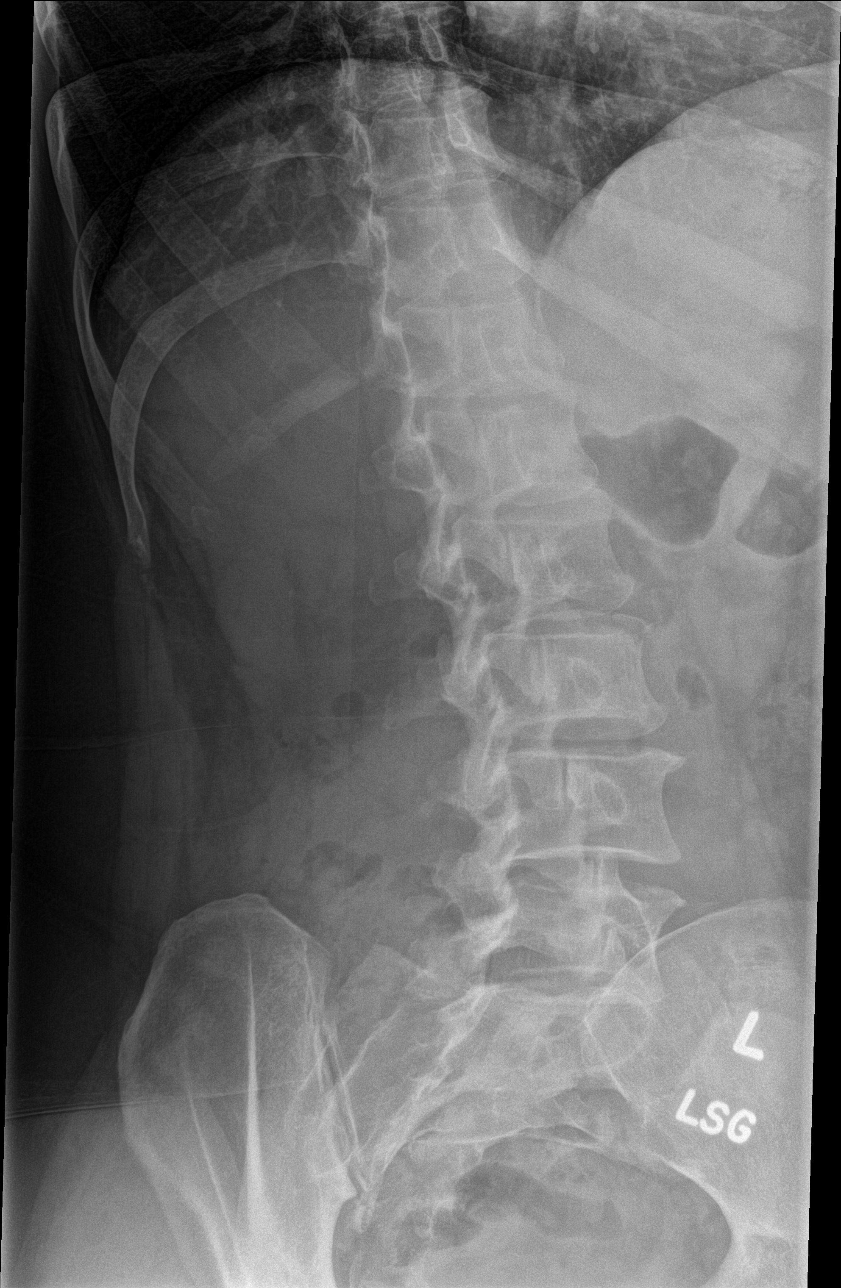

[l-spine lat]
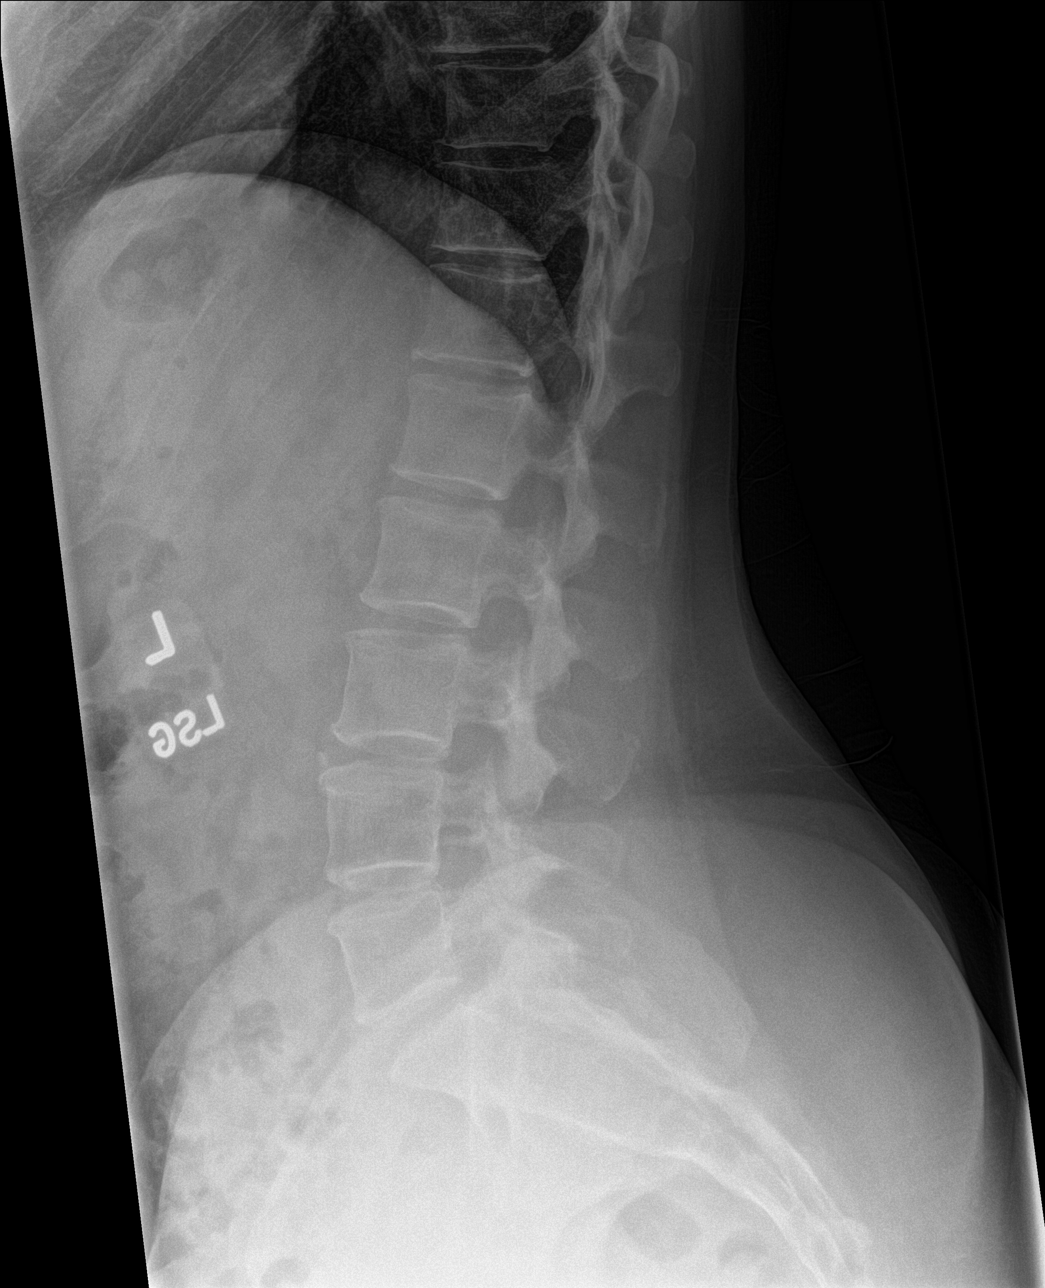

[l-spine l5/s1]
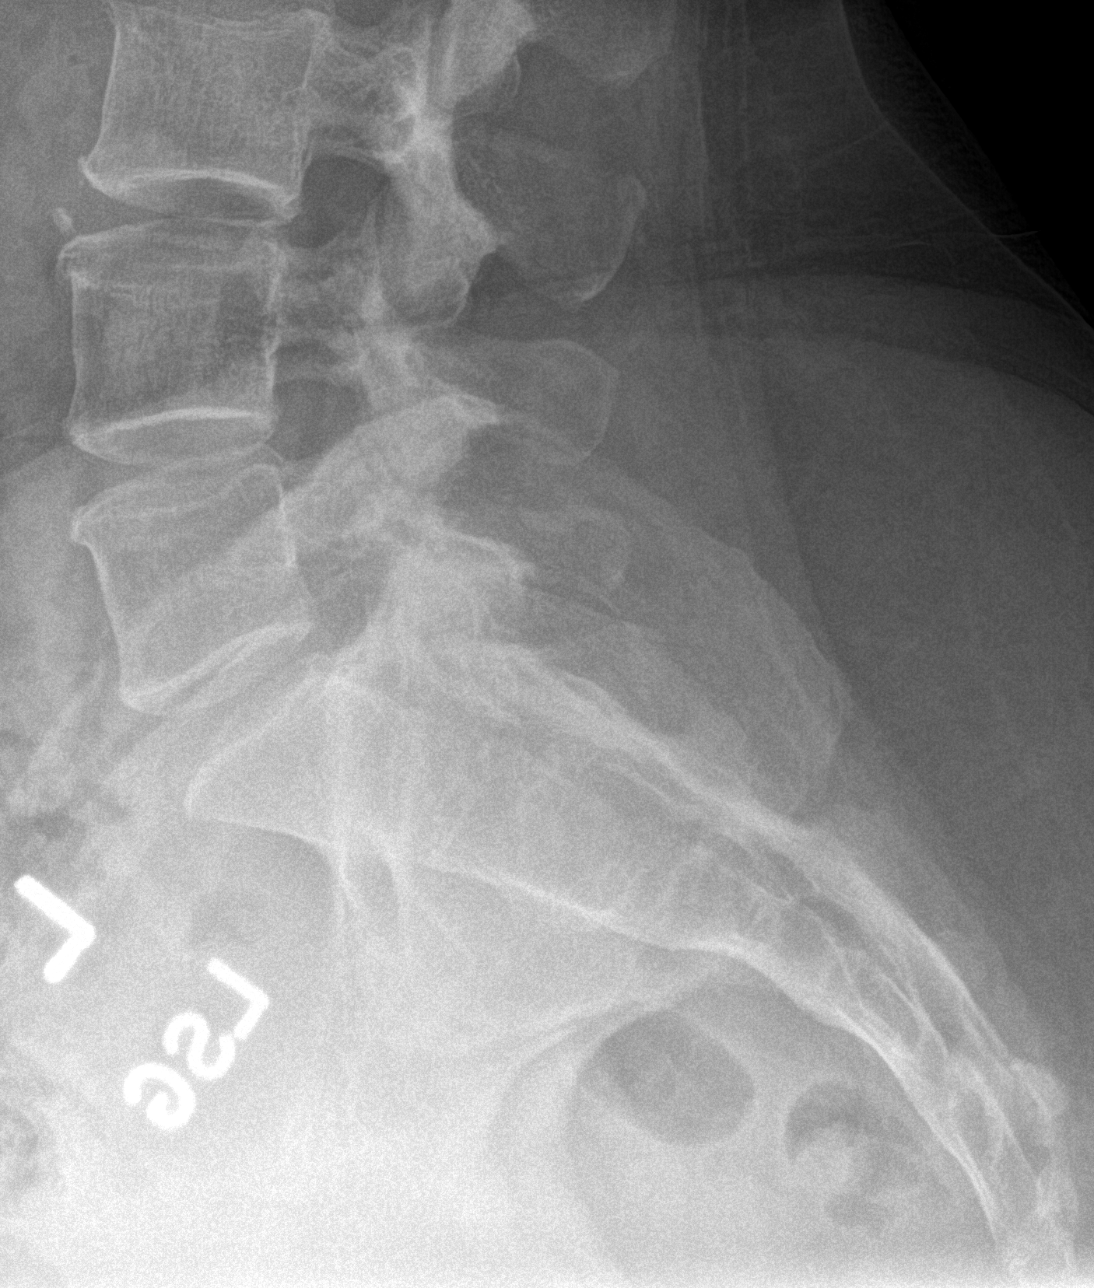

[5 of 5 positions shown; findings below may reference images not displayed]

FINDINGS: Minimal curvature lower thoracic and lumbar spine.

No significant lumbar disc space narrowing.

No compression fracture or pars defect.

Minimal degenerative changes anterior aspect T9-10.

Minimal sclerosis right sacroiliac joint.
IMPRESSION: No significant lumbar disc space narrowing.

Minimal sclerosis right sacroiliac joint.

## 2019-08-28 ENCOUNTER — Other Ambulatory Visit: Payer: Self-pay | Admitting: Family Medicine

## 2019-08-28 DIAGNOSIS — Z1231 Encounter for screening mammogram for malignant neoplasm of breast: Secondary | ICD-10-CM

## 2019-11-30 DIAGNOSIS — M21622 Bunionette of left foot: Secondary | ICD-10-CM | POA: Diagnosis not present

## 2019-11-30 DIAGNOSIS — M21621 Bunionette of right foot: Secondary | ICD-10-CM | POA: Diagnosis not present

## 2019-11-30 DIAGNOSIS — M21612 Bunion of left foot: Secondary | ICD-10-CM | POA: Diagnosis not present

## 2019-11-30 DIAGNOSIS — M21611 Bunion of right foot: Secondary | ICD-10-CM | POA: Diagnosis not present

## 2020-02-11 ENCOUNTER — Ambulatory Visit: Payer: BC Managed Care – PPO | Admitting: Family Medicine

## 2020-02-11 ENCOUNTER — Encounter: Payer: Self-pay | Admitting: Family Medicine

## 2020-02-11 ENCOUNTER — Other Ambulatory Visit: Payer: Self-pay

## 2020-02-11 VITALS — BP 116/74 | HR 72 | Temp 97.6°F | Ht 67.25 in | Wt 180.0 lb

## 2020-02-11 DIAGNOSIS — L304 Erythema intertrigo: Secondary | ICD-10-CM

## 2020-02-11 DIAGNOSIS — F17200 Nicotine dependence, unspecified, uncomplicated: Secondary | ICD-10-CM

## 2020-02-11 MED ORDER — KETOCONAZOLE 2 % EX CREA: 1.0000 "application " | TOPICAL_CREAM | Freq: Every day | CUTANEOUS | 0 refills | Status: DC

## 2020-02-11 MED ORDER — FLUCONAZOLE 150 MG PO TABS: 150.0000 mg | ORAL_TABLET | Freq: Once | ORAL | 0 refills | Status: AC

## 2020-02-11 NOTE — Assessment & Plan Note (Signed)
Disc in detail risks of smoking and possible outcomes including copd, vascular/ heart disease, cancer , respiratory and sinus infections  Pt voices understanding  In a very stressful situation at home Not ready to quit yet

## 2020-02-11 NOTE — Assessment & Plan Note (Signed)
Suspect yeast-under breasts (also a tiny spot on R face)  Complicated by many sks as well (these irritate)  Disc imp of keeping area clean and dry  Px ketoconazole cream  Also diflucan pill  Disc use of antifungal powder to prevent re occurrence  Update if not starting to improve in a week or if worsening

## 2020-02-11 NOTE — Patient Instructions (Addendum)
Keep areas clean and dry  Dry with cool setting of a hair dryer  Stay cool   Use ketoconazole cream as directed to affected areas  Take diflucan one pill orally    Update if not starting to improve in a week or if worsening    In between flares- you can try an antifungal powder

## 2020-02-11 NOTE — Progress Notes (Signed)
   Subjective:    Patient ID: Kathleen Rowe, female    DOB: 1965-12-28, 54 y.o.   MRN: [id number]  This visit occurred during the SARS-CoV-2 public health emergency.  Safety protocols were in place, including screening questions prior to the visit, additional usage of staff PPE, and extensive cleaning of exam room while observing appropriate contact time as indicated for disinfecting solutions.    HPI Pt presents for c/o rash under breast and hot flashes   Wt Readings from Last 3 Encounters:  02/11/20 180 lb (81.6 kg)  03/24/18 172 lb 8 oz (78.2 kg)  07/24/16 172 lb (78 kg)   27.98 kg/m    Rash is under both breasts -2 months  ? Yeast  Sweats a lot /also hot flashes/exercise   She takes a shower immed after exercise    Very itchy  Used an anti fungal spray- it gave her some relief   Had a tiny area on R face-used some monistat     Smoking status   Review of Systems     Objective:   Physical Exam Constitutional:      General: She is not in acute distress.    Appearance: Normal appearance. She is normal weight. She is not ill-appearing.  Eyes:     General:        Right eye: No discharge.        Left eye: No discharge.     Pupils: Pupils are equal, round, and reactive to light.  Cardiovascular:     Rate and Rhythm: Normal rate and regular rhythm.  Pulmonary:     Effort: Pulmonary effort is normal. No respiratory distress.     Breath sounds: Normal breath sounds.  Musculoskeletal:     Cervical back: Normal range of motion.  Lymphadenopathy:     Cervical: No cervical adenopathy.  Skin:    General: Skin is warm and dry.     Findings: Rash present.     Comments: Diffuse sks - trunk and back   Erythematous rash under breasts- some confluent areas and a few satellite lesions  No vesicles   1/2 cm of flat erythema on R lat cheek  No excoriations   Neurological:     Mental Status: She is alert.     Sensory: No sensory deficit.  Psychiatric:        Mood and  Affect: Mood normal.           Assessment & Plan:   Problem List Items Addressed This Visit      Musculoskeletal and Integument   Intertrigo - Primary    Suspect yeast-under breasts (also a tiny spot on R face)  Complicated by many sks as well (these irritate)  Disc imp of keeping area clean and dry  Px ketoconazole cream  Also diflucan pill  Disc use of antifungal powder to prevent re occurrence  Update if not starting to improve in a week or if worsening           Other   Smoker    Disc in detail risks of smoking and possible outcomes including copd, vascular/ heart disease, cancer , respiratory and sinus infections  Pt voices understanding  In a very stressful situation at home Not ready to quit yet

## 2020-05-25 ENCOUNTER — Ambulatory Visit (INDEPENDENT_AMBULATORY_CARE_PROVIDER_SITE_OTHER): Payer: BC Managed Care – PPO | Admitting: *Deleted

## 2020-05-25 DIAGNOSIS — Z23 Encounter for immunization: Secondary | ICD-10-CM | POA: Diagnosis not present

## 2020-08-01 ENCOUNTER — Other Ambulatory Visit: Payer: Self-pay | Admitting: Family Medicine

## 2020-08-02 NOTE — Telephone Encounter (Signed)
??   If directions are okay or if need to be changed, last filled on 09/19/18 #15 tabs with 3 refills with directions of 1 tablet 5x daily, last OV was an acute appt on 02/11/20, please advise

## 2020-08-04 DIAGNOSIS — Z124 Encounter for screening for malignant neoplasm of cervix: Secondary | ICD-10-CM | POA: Diagnosis not present

## 2020-08-04 DIAGNOSIS — Z01419 Encounter for gynecological examination (general) (routine) without abnormal findings: Secondary | ICD-10-CM | POA: Diagnosis not present

## 2020-08-19 DIAGNOSIS — L820 Inflamed seborrheic keratosis: Secondary | ICD-10-CM | POA: Diagnosis not present

## 2020-08-19 DIAGNOSIS — L42 Pityriasis rosea: Secondary | ICD-10-CM | POA: Diagnosis not present

## 2020-08-19 DIAGNOSIS — L308 Other specified dermatitis: Secondary | ICD-10-CM | POA: Diagnosis not present

## 2022-03-26 ENCOUNTER — Telehealth: Payer: Self-pay | Admitting: Family Medicine

## 2022-03-26 NOTE — Telephone Encounter (Signed)
Refill request Acyclovir Last refill 11/16/221 Last office visit 02/11/20

## 2022-03-26 NOTE — Telephone Encounter (Signed)
Refilled once  Due for annual exam-please schedule

## 2022-03-26 NOTE — Telephone Encounter (Signed)
Please call patient and schedule annual exam.

## 2022-03-27 ENCOUNTER — Other Ambulatory Visit: Payer: Self-pay | Admitting: Family Medicine

## 2022-03-27 NOTE — Telephone Encounter (Signed)
Patient has been scheduled

## 2022-03-27 NOTE — Telephone Encounter (Signed)
Noted  

## 2022-04-01 ENCOUNTER — Telehealth: Payer: Self-pay | Admitting: Family Medicine

## 2022-04-01 DIAGNOSIS — Z0000 Encounter for general adult medical examination without abnormal findings: Secondary | ICD-10-CM

## 2022-04-01 NOTE — Telephone Encounter (Signed)
-----   Message from Alvina Chou sent at 03/28/2022  7:55 AM EDT ----- Regarding: Lab orders for Monday, 7.17.23 Patient is scheduled for CPX labs, please order future labs, Thanks , Camelia Eng

## 2022-04-02 ENCOUNTER — Other Ambulatory Visit (INDEPENDENT_AMBULATORY_CARE_PROVIDER_SITE_OTHER): Payer: No Typology Code available for payment source

## 2022-04-02 DIAGNOSIS — Z0000 Encounter for general adult medical examination without abnormal findings: Secondary | ICD-10-CM

## 2022-04-02 LAB — COMPREHENSIVE METABOLIC PANEL
ALT: 15 U/L (ref 0–35)
AST: 16 U/L (ref 0–37)
Albumin: 4.3 g/dL (ref 3.5–5.2)
Alkaline Phosphatase: 46 U/L (ref 39–117)
BUN: 16 mg/dL (ref 6–23)
CO2: 26 mEq/L (ref 19–32)
Calcium: 9 mg/dL (ref 8.4–10.5)
Chloride: 106 mEq/L (ref 96–112)
Creatinine, Ser: 0.82 mg/dL (ref 0.40–1.20)
GFR: 80.69 mL/min (ref >60.00)
Glucose, Bld: 89 mg/dL (ref 70–99)
Potassium: 4.2 mEq/L (ref 3.5–5.1)
Sodium: 139 mEq/L (ref 135–145)
Total Bilirubin: 0.6 mg/dL (ref 0.2–1.2)
Total Protein: 6.4 g/dL (ref 6.0–8.3)

## 2022-04-02 LAB — CBC WITH DIFFERENTIAL/PLATELET
Basophils Absolute: 0.1 10*3/uL (ref 0.0–0.1)
Basophils Relative: 1.1 % (ref 0.0–3.0)
Eosinophils Absolute: 0.1 10*3/uL (ref 0.0–0.7)
Eosinophils Relative: 1.1 % (ref 0.0–5.0)
HCT: 39.3 % (ref 36.0–46.0)
Hemoglobin: 13.2 g/dL (ref 12.0–15.0)
Lymphocytes Relative: 32.1 % (ref 12.0–46.0)
Lymphs Abs: 1.8 10*3/uL (ref 0.7–4.0)
MCHC: 33.4 g/dL (ref 30.0–36.0)
MCV: 92 fl (ref 78.0–100.0)
Monocytes Absolute: 0.3 10*3/uL (ref 0.1–1.0)
Monocytes Relative: 4.9 % (ref 3.0–12.0)
Neutro Abs: 3.4 10*3/uL (ref 1.4–7.7)
Neutrophils Relative %: 60.8 % (ref 43.0–77.0)
Platelets: 187 10*3/uL (ref 150.0–400.0)
RBC: 4.28 Mil/uL (ref 3.87–5.11)
RDW: 13.7 % (ref 11.5–15.5)
WBC: 5.5 10*3/uL (ref 4.0–10.5)

## 2022-04-02 LAB — LIPID PANEL
Cholesterol: 179 mg/dL (ref 0–200)
HDL: 57.7 mg/dL (ref >39.00)
LDL Cholesterol: 106 mg/dL — ABNORMAL HIGH (ref 0–99)
NonHDL: 121.62
Total CHOL/HDL Ratio: 3
Triglycerides: 76 mg/dL (ref 0.0–149.0)
VLDL: 15.2 mg/dL (ref 0.0–40.0)

## 2022-04-02 LAB — TSH: TSH: 1.31 u[IU]/mL (ref 0.35–5.50)

## 2022-04-09 ENCOUNTER — Encounter: Payer: Self-pay | Admitting: Family Medicine

## 2022-04-09 ENCOUNTER — Ambulatory Visit (INDEPENDENT_AMBULATORY_CARE_PROVIDER_SITE_OTHER): Payer: No Typology Code available for payment source | Admitting: Family Medicine

## 2022-04-09 VITALS — BP 110/70 | HR 72 | Temp 97.6°F | Ht 67.25 in | Wt 178.4 lb

## 2022-04-09 DIAGNOSIS — Z0000 Encounter for general adult medical examination without abnormal findings: Secondary | ICD-10-CM

## 2022-04-09 DIAGNOSIS — F17200 Nicotine dependence, unspecified, uncomplicated: Secondary | ICD-10-CM | POA: Diagnosis not present

## 2022-04-09 DIAGNOSIS — Z8619 Personal history of other infectious and parasitic diseases: Secondary | ICD-10-CM | POA: Diagnosis not present

## 2022-04-09 DIAGNOSIS — L304 Erythema intertrigo: Secondary | ICD-10-CM

## 2022-04-09 NOTE — Patient Instructions (Addendum)
If you want colon cancer screening of any kind let us know   I recommend a yearly mammogram and gyn visit    Try to get 1200-1500 mg of calcium per day with at least 1000 iu of vitamin D - for bone health  Take care of yourself  Keep walking ! Use sun protection   Keep thinking about quitting smoking

## 2022-04-09 NOTE — Assessment & Plan Note (Signed)
Pt sees derm  Some issues with athlete's foot and uses otc med  Enc her to keep feet dry whenever possible

## 2022-04-09 NOTE — Assessment & Plan Note (Signed)
Reviewed health habits including diet and exercise and skin cancer prevention Reviewed appropriate screening tests for age  Also reviewed health mt list, fam hx and immunization status , as well as social and family history   See HPI Labs reviewed  Pap utd 2021 but I do not have the result  Declines colon cancer screening  Plans to get mammogram at gyn visit  Sent for last pap /mam report from [facility] Disc rec for ca and D Disc smoking cessation/she is not ready Sees derm regularly  Encouraged sun protection

## 2022-04-09 NOTE — Assessment & Plan Note (Signed)
Disc in detail risks of smoking and possible outcomes including copd, vascular/ heart disease, cancer , respiratory and sinus infections  Pt voices understanding Pt is not yet ready to quit Handout given re: steps to quitting  Enc her to keep thinking about it

## 2022-04-09 NOTE — Progress Notes (Signed)
Subjective:    Patient ID: Kathleen Rowe, female    DOB: 03/20/1966, 56 y.o.   MRN: [id number]  HPI Here for health maintenance exam and to review chronic medical problems    Wt Readings from Last 3 Encounters:  04/09/22 178 lb 6 oz (80.9 kg)  02/11/20 180 lb (81.6 kg)  03/24/18 172 lb 8 oz (78.2 kg)   27.73 kg/m  Doing well  Walking every morning  Early riser    Immunization History  Administered Date(s) Administered   Influenza Split 07/25/2011, 07/10/2012   Influenza Whole 08/04/2009, 06/28/2010   Influenza,inj,Quad PF,6+ Mos 07/07/2013, 07/21/2014, 07/08/2015, 07/24/2016, 07/31/2017, 06/26/2018, 07/14/2019, 05/25/2020   Tdap 07/24/2016   Health Maintenance Due  Topic Date Due   COVID-19 Vaccine (1) Never done   Hepatitis C Screening  Never done   COLONOSCOPY (Pts 45-73yrs Insurance coverage will need to be confirmed)  Never done   Zoster Vaccines- Shingrix (1 of 2) Never done   MAMMOGRAM  09/21/2017   PAP SMEAR-Modifier  04/27/2018   Colon cancer screening : not interested   Mammogram 2018- may have had one in 2021  (does them at [facility])  Self breast exam- no lumps   Supplements -none  Eats yogurt every day    Pap 07/25/20 -she thinks it was normal  No period in a few years    Smoking status : still 1/2 ppd  Lost her job 1 1/2 y ago- stressful Actually enjoying being at home / big change for her   Saw dermatology  SKs bother her /has had intertrigo in the past  Using more moisturizers   BP Readings from Last 3 Encounters:  04/09/22 110/70  02/11/20 116/74  03/24/18 122/68   Pulse Readings from Last 3 Encounters:  04/09/22 72  02/11/20 72  03/24/18 63     Now has more happiness / embracing faith and walking  Getting along better with family also   Cholesterol  Lab Results  Component Value Date   CHOL 179 04/02/2022   CHOL 171 06/27/2016   Lab Results  Component Value Date   HDL 57.70 04/02/2022   HDL 56.20 06/27/2016    Lab Results  Component Value Date   LDLCALC 106 (H) 04/02/2022   LDLCALC 101 (H) 06/27/2016   Lab Results  Component Value Date   TRIG 76.0 04/02/2022   TRIG 68.0 06/27/2016   Lab Results  Component Value Date   CHOLHDL 3 04/02/2022   CHOLHDL 3 06/27/2016   No results found for: "LDLDIRECT"  Not a lot of trans fats  Lots of vegetables  Avoids excess sugar     Other labs Results for orders placed or performed in visit on 04/02/22  CBC with Differential/Platelet  Result Value Ref Range   WBC 5.5 4.0 - 10.5 K/uL   RBC 4.28 3.87 - 5.11 Mil/uL   Hemoglobin 13.2 12.0 - 15.0 g/dL   HCT 72.5 36.6 - 44.0 %   MCV 92.0 78.0 - 100.0 fl   MCHC 33.4 30.0 - 36.0 g/dL   RDW 34.7 42.5 - 95.6 %   Platelets 187.0 150.0 - 400.0 K/uL   Neutrophils Relative % 60.8 43.0 - 77.0 %   Lymphocytes Relative 32.1 12.0 - 46.0 %   Monocytes Relative 4.9 3.0 - 12.0 %   Eosinophils Relative 1.1 0.0 - 5.0 %   Basophils Relative 1.1 0.0 - 3.0 %   Neutro Abs 3.4 1.4 - 7.7 K/uL   Lymphs Abs  1.8 0.7 - 4.0 K/uL   Monocytes Absolute 0.3 0.1 - 1.0 K/uL   Eosinophils Absolute 0.1 0.0 - 0.7 K/uL   Basophils Absolute 0.1 0.0 - 0.1 K/uL  TSH  Result Value Ref Range   TSH 1.31 0.35 - 5.50 uIU/mL  Lipid panel  Result Value Ref Range   Cholesterol 179 0 - 200 mg/dL   Triglycerides 08.6 0.0 - 149.0 mg/dL   HDL 57.84 >69.62 mg/dL   VLDL 95.2 0.0 - 84.1 mg/dL   LDL Cholesterol 324 (H) 0 - 99 mg/dL   Total CHOL/HDL Ratio 3    NonHDL 121.62   Comprehensive metabolic panel  Result Value Ref Range   Sodium 139 135 - 145 mEq/L   Potassium 4.2 3.5 - 5.1 mEq/L   Chloride 106 96 - 112 mEq/L   CO2 26 19 - 32 mEq/L   Glucose, Bld 89 70 - 99 mg/dL   BUN 16 6 - 23 mg/dL   Creatinine, Ser 4.01 0.40 - 1.20 mg/dL   Total Bilirubin 0.6 0.2 - 1.2 mg/dL   Alkaline Phosphatase 46 39 - 117 U/L   AST 16 0 - 37 U/L   ALT 15 0 - 35 U/L   Total Protein 6.4 6.0 - 8.3 g/dL   Albumin 4.3 3.5 - 5.2 g/dL   GFR 02.72  >53.66 mL/min   Calcium 9.0 8.4 - 10.5 mg/dL     Patient Active Problem List   Diagnosis Date Noted   History of cold sores 04/09/2022   Intertrigo 02/11/2020   Smoker 07/26/2016   Screening mammogram, encounter for 07/24/2016   Routine general medical examination at a health care facility 06/24/2016   Past Medical History:  Diagnosis Date   Acute upper respiratory infections of unspecified site    Cervicalgia    History of tobacco abuse    Migraine    PMDD (premenstrual dysphoric disorder)    Spasm of muscle    Past Surgical History:  Procedure Laterality Date   BREAST ENHANCEMENT SURGERY     CESAREAN SECTION     x 2   Social History   Tobacco Use   Smoking status: Every Day    Packs/day: 0.50    Types: Cigarettes   Smokeless tobacco: Never  Substance Use Topics   Alcohol use: No   Drug use: No   Family History  Problem Relation Age of Onset   Diabetes Son    Allergies  Allergen Reactions   Hydrocodone-Acetaminophen    Current Outpatient Medications on File Prior to Visit  Medication Sig Dispense Refill   acyclovir (ZOVIRAX) 400 MG tablet TAKE 1 TABLET BY MOUTH FIVE TIMES DAILY 15 tablet 0   No current facility-administered medications on file prior to visit.     Review of Systems  Constitutional:  Negative for activity change, appetite change, fatigue, fever and unexpected weight change.  HENT:  Negative for congestion, ear pain, rhinorrhea, sinus pressure and sore throat.   Eyes:  Negative for pain, redness and visual disturbance.  Respiratory:  Negative for cough, shortness of breath and wheezing.   Cardiovascular:  Negative for chest pain and palpitations.  Gastrointestinal:  Negative for abdominal pain, blood in stool, constipation and diarrhea.  Endocrine: Negative for polydipsia and polyuria.  Genitourinary:  Negative for dysuria, frequency and urgency.  Musculoskeletal:  Negative for arthralgias, back pain and myalgias.  Skin:  Negative for  pallor and rash.       Athlete's foot   Cold sores  Allergic/Immunologic: Negative for environmental allergies.  Neurological:  Negative for dizziness, syncope and headaches.  Hematological:  Negative for adenopathy. Does not bruise/bleed easily.  Psychiatric/Behavioral:  Negative for decreased concentration and dysphoric mood. The patient is not nervous/anxious.        Objective:   Physical Exam Constitutional:      General: She is not in acute distress.    Appearance: Normal appearance. She is well-developed and normal weight. She is not ill-appearing or diaphoretic.  HENT:     Head: Normocephalic and atraumatic.     Right Ear: Tympanic membrane, ear canal and external ear normal.     Left Ear: Tympanic membrane, ear canal and external ear normal.     Nose: Nose normal. No congestion.     Mouth/Throat:     Mouth: Mucous membranes are moist.     Pharynx: Oropharynx is clear. No posterior oropharyngeal erythema.  Eyes:     General: No scleral icterus.    Extraocular Movements: Extraocular movements intact.     Conjunctiva/sclera: Conjunctivae normal.     Pupils: Pupils are equal, round, and reactive to light.  Neck:     Thyroid: No thyromegaly.     Vascular: No carotid bruit or JVD.  Cardiovascular:     Rate and Rhythm: Normal rate and regular rhythm.     Pulses: Normal pulses.     Heart sounds: Normal heart sounds.     No gallop.  Pulmonary:     Effort: Pulmonary effort is normal. No respiratory distress.     Breath sounds: Normal breath sounds. No wheezing.     Comments: Good air exch Chest:     Chest wall: No tenderness.  Abdominal:     General: Bowel sounds are normal. There is no distension or abdominal bruit.     Palpations: Abdomen is soft. There is no mass.     Tenderness: There is no abdominal tenderness.     Hernia: No hernia is present.  Genitourinary:    Comments: Breast exam: No mass, nodules, thickening, tenderness, bulging, retraction, inflamation,  nipple discharge or skin changes noted.  No axillary or clavicular LA.    Breast implants noted   Musculoskeletal:        General: No tenderness. Normal range of motion.     Cervical back: Normal range of motion and neck supple. No rigidity. No muscular tenderness.     Right lower leg: No edema.     Left lower leg: No edema.     Comments: No kyphosis   Lymphadenopathy:     Cervical: No cervical adenopathy.  Skin:    General: Skin is warm and dry.     Coloration: Skin is not pale.     Findings: No erythema or rash.     Comments: Many sks -diffuse  Some lentigines   Neurological:     Mental Status: She is alert. Mental status is at baseline.     Cranial Nerves: No cranial nerve deficit.     Motor: No abnormal muscle tone.     Coordination: Coordination normal.     Gait: Gait normal.     Deep Tendon Reflexes: Reflexes are normal and symmetric. Reflexes normal.  Psychiatric:        Mood and Affect: Mood normal.        Cognition and Memory: Cognition and memory normal.           Assessment & Plan:   Problem List Items Addressed This Visit  Musculoskeletal and Integument   Intertrigo    Pt sees derm  Some issues with athlete's foot and uses otc med  Enc her to keep feet dry whenever possible        Other   History of cold sores    Pt uses acyclovir prn      Routine general medical examination at a health care facility - Primary    Reviewed health habits including diet and exercise and skin cancer prevention Reviewed appropriate screening tests for age  Also reviewed health mt list, fam hx and immunization status , as well as social and family history   See HPI Labs reviewed  Pap utd 2021 but I do not have the result  Declines colon cancer screening  Plans to get mammogram at gyn visit  Sent for last pap /mam report from [facility] Disc rec for ca and D Disc smoking cessation/she is not ready Sees derm regularly  Encouraged sun protection         Smoker    Disc in detail risks of smoking and possible outcomes including copd, vascular/ heart disease, cancer , respiratory and sinus infections  Pt voices understanding Pt is not yet ready to quit Handout given re: steps to quitting  Enc her to keep thinking about it

## 2022-04-09 NOTE — Assessment & Plan Note (Signed)
Pt uses acyclovir prn

## 2022-10-25 ENCOUNTER — Telehealth: Payer: Self-pay | Admitting: Family Medicine

## 2022-10-25 MED ORDER — HYDROXYZINE PAMOATE 25 MG PO CAPS: 25.0000 mg | ORAL_CAPSULE | Freq: Three times a day (TID) | ORAL | 0 refills | Status: AC | PRN

## 2022-10-25 NOTE — Telephone Encounter (Signed)
I sent in hydroxyzine Caution of sedation

## 2022-10-25 NOTE — Telephone Encounter (Signed)
Pt notified Rx sent to pharmacy and sedation caution given  

## 2022-10-25 NOTE — Telephone Encounter (Signed)
Patient is traveling February 24 and has anxiety about flying. She was wanting to know if Dr. Glori Bickers could call something in to relax her. She would like a call when this is done. Please advise. Thank you!

## 2022-10-25 NOTE — Addendum Note (Signed)
Addended by: Loura Pardon A on: 10/25/2022 10:35 AM   Modules accepted: Orders

## 2022-12-31 ENCOUNTER — Other Ambulatory Visit: Payer: Self-pay | Admitting: Family Medicine

## 2022-12-31 MED ORDER — ACYCLOVIR 400 MG PO TABS: 400.0000 mg | ORAL_TABLET | Freq: Every day | ORAL | 3 refills | Status: AC

## 2022-12-31 NOTE — Telephone Encounter (Signed)
Last filled on 03/26/22 #15 tabs/ 0 refills, last OV was a CPE on 04/09/22
